# Patient Record
Sex: Female | Born: 1950 | Race: Black or African American | Hispanic: No | Marital: Married | Smoking: Never smoker
Health system: Southern US, Community
[De-identification: ages and names within clinical notes are randomized; demographics above are authoritative.]

## PROBLEM LIST (undated history)

## (undated) DIAGNOSIS — R112 Nausea with vomiting, unspecified: Secondary | ICD-10-CM

## (undated) DIAGNOSIS — R203 Hyperesthesia: Secondary | ICD-10-CM

## (undated) DIAGNOSIS — T859XXA Unspecified complication of internal prosthetic device, implant and graft, initial encounter: Secondary | ICD-10-CM

## (undated) DIAGNOSIS — M2550 Pain in unspecified joint: Secondary | ICD-10-CM

## (undated) DIAGNOSIS — I10 Essential (primary) hypertension: Secondary | ICD-10-CM

## (undated) DIAGNOSIS — Z91018 Allergy to other foods: Secondary | ICD-10-CM

## (undated) DIAGNOSIS — K5900 Constipation, unspecified: Secondary | ICD-10-CM

## (undated) DIAGNOSIS — E785 Hyperlipidemia, unspecified: Secondary | ICD-10-CM

## (undated) DIAGNOSIS — K219 Gastro-esophageal reflux disease without esophagitis: Secondary | ICD-10-CM

## (undated) DIAGNOSIS — R0609 Other forms of dyspnea: Secondary | ICD-10-CM

## (undated) DIAGNOSIS — M170 Bilateral primary osteoarthritis of knee: Secondary | ICD-10-CM

## (undated) DIAGNOSIS — E7800 Pure hypercholesterolemia, unspecified: Secondary | ICD-10-CM

## (undated) DIAGNOSIS — J302 Other seasonal allergic rhinitis: Secondary | ICD-10-CM

## (undated) DIAGNOSIS — Z9889 Other specified postprocedural states: Secondary | ICD-10-CM

## (undated) HISTORY — DX: Constipation, unspecified: K59.00

## (undated) HISTORY — PX: MASTECTOMY: SHX3

## (undated) HISTORY — PX: TONSILLECTOMY: SUR1361

## (undated) HISTORY — DX: Allergy to other foods: Z91.018

## (undated) HISTORY — PX: ABDOMINAL HYSTERECTOMY: SHX81

## (undated) HISTORY — DX: Pain in unspecified joint: M25.50

## (undated) HISTORY — PX: TOTAL KNEE ARTHROPLASTY: SHX125

## (undated) HISTORY — PX: APPENDECTOMY: SHX54

## (undated) HISTORY — PX: REMOVAL OF BILATERAL TISSUE EXPANDERS WITH PLACEMENT OF BILATERAL BREAST IMPLANTS: SHX6431

## (undated) HISTORY — DX: Hyperlipidemia, unspecified: E78.5

## (undated) HISTORY — PX: PLACEMENT OF BREAST IMPLANTS: SHX6334

## (undated) HISTORY — DX: Other forms of dyspnea: R06.09

## (undated) HISTORY — PX: BREAST BIOPSY: SHX20

---

## 2001-09-12 ENCOUNTER — Ambulatory Visit (HOSPITAL_BASED_OUTPATIENT_CLINIC_OR_DEPARTMENT_OTHER): Admission: RE | Admit: 2001-09-12 | Discharge: 2001-09-12 | Payer: Self-pay | Admitting: Orthopedic Surgery

## 2001-09-12 HISTORY — PX: KNEE ARTHROSCOPY: SUR90

## 2002-10-22 ENCOUNTER — Encounter: Payer: Self-pay | Admitting: Orthopedic Surgery

## 2002-10-25 ENCOUNTER — Inpatient Hospital Stay (HOSPITAL_COMMUNITY): Admission: RE | Admit: 2002-10-25 | Discharge: 2002-10-29 | Payer: Self-pay | Admitting: Orthopedic Surgery

## 2002-10-25 HISTORY — PX: TOTAL KNEE ARTHROPLASTY: SHX125

## 2007-09-17 ENCOUNTER — Ambulatory Visit (HOSPITAL_BASED_OUTPATIENT_CLINIC_OR_DEPARTMENT_OTHER): Admission: RE | Admit: 2007-09-17 | Discharge: 2007-09-17 | Payer: Self-pay | Admitting: Specialist

## 2007-09-17 ENCOUNTER — Encounter (INDEPENDENT_AMBULATORY_CARE_PROVIDER_SITE_OTHER): Payer: Self-pay | Admitting: Specialist

## 2007-09-17 HISTORY — PX: BREAST CAPSULECTOMY WITH IMPLANT EXCHANGE: SHX5592

## 2009-02-07 ENCOUNTER — Emergency Department (HOSPITAL_BASED_OUTPATIENT_CLINIC_OR_DEPARTMENT_OTHER): Admission: EM | Admit: 2009-02-07 | Discharge: 2009-02-07 | Payer: Self-pay | Admitting: Emergency Medicine

## 2009-02-07 ENCOUNTER — Ambulatory Visit: Payer: Self-pay | Admitting: Diagnostic Radiology

## 2011-03-01 NOTE — Op Note (Signed)
Brittney [name], [name]               ACCOUNT NO.:  [id number]   MEDICAL RECORD NO.:  [id number]          PATIENT TYPE:  AMB   LOCATION:  DSC                          FACILITY:  [facility]   PHYSICIAN:  Earvin Hansen L. Truesdale, M.D.DATE OF BIRTH:  23-Feb-1951   DATE OF PROCEDURE:  09/17/2007  DATE OF DISCHARGE:                               OPERATIVE REPORT   INDICATIONS:  A 60-year lady status post bilateral subcutaneous  mastectomies and reconstructions for severe precancerous fibrocystic  breast changes several years ago.  The patient had been lost to followup  for the last 8-9 years and came to see me within the last 2-3 weeks for  deflation of the right breast.  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  With implants being over 95 years old, I recommended  that we do a right capsulectomy, right implant exchange and left implant  exchange.  She also demonstrated some accessory breast tissue on the  right side that has caused her to have irritation and chaffing because  of the bulging.   PROCEDURES PLANNED:  Right capsulectomy, right implant exchange after  removal, left implant exchange, excision of accessory breast tissue  right breast upper quadrant and axillary regions.   ANESTHESIA:  General.   PROCEDURE IN DETAIL:  Preoperatively the patient was sat up and drawn  for the midline as well as demarcation of the inframammary folds,  anterior and anterior axillary lines.  She underwent general anesthesia  and intubated orally.  Prep was done to the chest, breast areas in a  routine fashion using Hibiclens soap and solution, walled off with  sterile towels and drapes thus making a sterile field.  One-quarter  percent Xylocaine with epinephrine injected locally in the right and  left breast areas.  The right inframammary fold incision was made  approximately 8 cm.  The incision was carried down to the  skin,  subcutaneous tissue and underlying pectoralis major muscle.  The muscle  was divided in the rectus fibers.  I was able to dissect down to the  capsule and using delicate dissection I was able to do an extra  capsulectomy with the implant still intact, but showing that it had  leakage from the saline compartment of a double-lumen type implant.  After proper hemostasis capsulectomy was fashioned throughout the space.  Hemostasis was maintained with the Bovie on coagulation.  After proper  hemostasis a Mentor smooth total saline implant, reference number  [id number], was placed within the pocket, 375 mL with good contour.  The muscles were repaired back with a 2-0 Monocryl, subcutaneous tissue  deep with 2-0 Monocryl.  The wounds were drained with a #7 fully fluted  Blake drain which was placed in the depths of the wound and brought out  through the lateral-most portion of the incision and when the skin was  closed subcuticularly with 3-0 Monocryl, it was tied off with the drain.   Next attention was drawn to the right axillary breast tissue.  A  curvilinear  incision made within the axillary area.  Dissection then  carried up to the excess tissue using Metzenbaum scissors.  I was able  to remove all that to give better contour and reduce the chance of  intertriginous changes in the future.   Next attention was drawn to the left breast tissue.  That inframammary  incision was opened.  Dissection was carried down the skin, subcutaneous  tissue, breast tissue to the underlying pectoralis major muscle.  The  muscle was divided in the direction of its fibers and that implant was  removed and then replaced likewise with a same type implant, Mentor  total saline, reference number [id number], lot number is [id number], the same  amount, 375 mL, with good contour.  It was closed likewise with 2-0  Monocryl in muscles and subcutaneous tissue and then running  subcuticular stitch of 3-0 Monocryl.   Steri-Strips and soft dressing was  applied to all the areas, a fluffy dressing and proper breast dressings.  She tolerated all procedures very well.  Estimated blood loss less than  100 mL.  Complications none.  She was then taken to recovery in  excellent condition.      Yaakov Guthrie. Shon Hough, M.D.  Electronically Signed     GLT/MEDQ  D:  09/17/2007  T:  09/17/2007  Job:  [id number]

## 2011-03-04 NOTE — Op Note (Signed)
[place]. [facility]  Patient:    [name], [name] Visit Number: [id number] MRN: [id number]          Service Type: Attending:  Harvie Junior, M.D. Dictated by:   Harvie Junior, M.D. Proc. Date: 09/12/01                             Operative Report  PREOPERATIVE DIAGNOSIS:  Lateral meniscal tear.  POSTOPERATIVE DIAGNOSIS: 1. Lateral meniscal tear. 2. Significant defect, lateral tibial plateau. 3. Multiple osteocartilaginous loose bodies. 4. Chondromalacia patella.  OPERATION PERFORMED: 1. Debridement of lateral meniscal tear. 2. Debridement of lateral tibial plateau. 3. Removal of multiple osteocartilaginous loose bodies with a grasper. 4. Debridement of chondromalacia patella.  SURGEON:  Harvie Junior, M.D.  ASSISTANT:  Currie Paris. Thedore Mins.  ANESTHESIA:  General.  INDICATIONS FOR PROCEDURE:  The patient is a 60 year old female with a long history of having significant lateral jointline pain.  She was having catching and locking.  She ultimately had a steroid injection which helped for a period of time but then ultimately results failed.  Because of complaints of intermittent catching and locking, the patient was taken to the operating room for examination under anesthesia and arthroscopy.  DESCRIPTION OF PROCEDURE:  The patient was taken to the operating room and after anesthetic was obtained with general anesthetic, the patient was positioned on the operating table.  The right leg was then prepped and draped in the usual sterile fashion.  Following this, routine arthroscopic examination of the knee revealed that there was an obvious tear of the posterolateral meniscus.  This was debrided with a straight-biting forceps back to a stable rim.  This unfortunately was almost all the way back to the rim posteriorly.  What was noted at the more significant abnormality was grade 4 change on the lateral tibial plateau and interestingly there was  no corresponding lesion on the lateral femoral condyle at least to flexion to 90 degrees.  The lateral tibial plateau lesion was debrided.  Multiple osteocartilaginous loose bodies were identified within the knee and were removed with a grasper.  The anterior cruciate was within normal limits.  The medial compartment was within normal limits.  Attention was turned up into the patellofemoral joint where there was noted to be some grade 2 and 3 changes in the patellofemoral joint.  The patella was debrided with a suction shaver.  At this point the cannula was removed.  The knee was suctioned dry.  One final check was made for any loose or fragmented pieces.  A final check was made with a probe to make sure the meniscus was stable and it was.  At this point the knee was copiously irrigated and suctioned dry.  Of note, the camera did fail during the case in terms of being able to take pictures.  We had excellent visualization throughout the case but the camera did fail at the midportion of the case. Dictated by:   Harvie Junior, M.D. Attending:  Harvie Junior, M.D. DD:  09/12/01 TD:  09/12/01 Job: [id number]

## 2011-03-04 NOTE — Op Note (Signed)
NAME:  [name], [name] NO.:  [id number]   MEDICAL RECORD NO.:  [id number]                   PATIENT TYPE:  INP   LOCATION:  2550                                 FACILITY:  [facility]   PHYSICIAN:  Harvie Junior, M.D.                DATE OF BIRTH:  1951-04-10   DATE OF PROCEDURE:  10/25/2002  DATE OF DISCHARGE:                                 OPERATIVE REPORT   PREOPERATIVE DIAGNOSIS:  Degenerative disk disease, right knee.   POSTOPERATIVE DIAGNOSIS:  Degenerative disk disease, right knee.   OPERATION PERFORMED:  Right total knee replacement with Depuy LCS system,  standard femoral component.  A 2.5 mm tibial component and a 10 mm bridging  bearing 35 mm all poly patella.   SURGEON:  Harvie Junior, M.D.   ASSISTANT:  Marshia Ly, P.A.   ANESTHESIA:  General.   INDICATIONS FOR PROCEDURE:  The patient is a 60 year old female with a long  history of having a significant lateral compartment disease.  She had  undergone arthroscopy, debridement, cortisone injection, Hyalgan injection.  All of this provided some temporary relief but because of continued  complaints of pain on the lateral side and knowing that she had significant  degeneration from arthritis, the patient has elected to undergo total knee  replacement.  She is brought to the operating room for this procedure.   DESCRIPTION OF PROCEDURE:  The patient was brought to the operating room and  after adequate anesthesia was obtained with general anesthetic, the patient  was placed supine on the operating table.  Right leg was prepped and draped  in sterile fashion.  Following Esmarch exsanguination of the extremity,  blood pressure tourniquet was inflated to 350 mmHg.  Following this, a  midline incision was made, subcutaneous tissue dissected down to the level  of the patella.  Extensor mechanism was identified and retracted medially.  The medial parapatellar arthrotomy was undertaken.   Medial and lateral  meniscectomy, anterior and posterior cruciate excision and following this, a  guide was used to gain alignment of the distal tibia.  The tibial guide was  cut perpendicular to the floor with a long axis, 10 degree posterior slope.  Following this, attention was turned to the femur which was sized to a  standard.  Cuts were made and a 40 degree valgus cut was made and Chamfer  cuts and the patient was then brought out for sizing the tibia 2.5 mm seemed  to be the best fit and this was impacted into place.  The trial components  were then put in place and range of motion was achieved.  Excellent range of  motion was achieved.  35 mm all poly patella was chosen and range of motion  was chosen with this.  The wound was then copiously irrigated and suctioned  dry.  Components  were removed.  A pulsatile lavage irrigation  was used and  the areas were dried.  The tibial component was then put into place first  and then the keel and the spines.  Following this, attention was turned to  the femoral side where the femoral component was cemented into placed with  the bridging bearing in place.  All excess cement was removed.  Attention  was turned to the patella.  The patella was cemented into place.  Excellent  range of motion was achieved and alignment was achieved.  At this point the  cement was allowed to dry with the knee in 10 degrees of flexion with  pressure and the patella clamp in place.  All excess cement had been  removed.  The knee was then copiously irrigated with pulsatile lavage  irrigation.  Medium Hemovac drain was put in place.  Medial parapatellar  arthrotomy was closed and the tourniquet was let down.  The remainder of the  closure was undertaken at this point with 0 and 2-0 Vicryl and 3-0 Maxon  subcuticular stitch.  Sterile compressive dressing was applied at this point  and the patient was taken to the recovery room where she was noted to be in  satisfactory  condition.  The estimated blood loss for this procedure was  none.                                                Harvie Junior, M.D.    Ranae Plumber  D:  10/25/2002  T:  10/25/2002  Job:  [id number]

## 2011-03-04 NOTE — Discharge Summary (Signed)
[name], [name] NO.:  [id number]   MEDICAL RECORD NO.:  [id number]                   PATIENT TYPE:  INP   LOCATION:  5005                                 FACILITY:  [facility]   PHYSICIAN:  Harvie Junior, M.D.                DATE OF BIRTH:  Feb 11, 1951   DATE OF ADMISSION:  10/25/2002  DATE OF DISCHARGE:  10/29/2002                                 DISCHARGE SUMMARY   ADMISSION DIAGNOSES:  1. Degenerative joint disease right knee.  2. Hypertension.  3. Gastroesophageal reflux disease.   DISCHARGE DIAGNOSES:  1. Degenerative joint disease right knee.  2. Hypertension.  3. Gastroesophageal reflux disease.   PROCEDURE:  Right total knee arthroplasty per Harvie Junior, M.D., on  October 25, 2002.   HISTORY OF PRESENT ILLNESS:  The patient is a 60 year old female with a long  history of right knee pain.  She has pain with ambulation and pain at night  which is waking her up from sleep.  She had a right knee arthroscopy done in  November of 2002 which showed degenerative changes.  She underwent Synvisc  injections and did have some cortisone injections which both gave her  temporary relief but she needed to have persistent disability with her right  knee and inability to ambulate without pain.  X-ray showed degenerative  changes with significant joint space narrowing and arthritic changes.  Based  upon her clinical and radiographic findings, she was felt to be a candidate  for right total knee arthroplasty and she was admitted for this.   PERTINENT LABORATORY DATA:  EKG on admission showed normal sinus rhythm,  normal EKG.   Hemoglobin on admission was 14.5, on postoperative day #1 her hemoglobin was  10.6, postoperative day #2 was 12.5, postoperative day #3 was 11.1.  Indices  within normal limits.  Protime on admission was 13.3 seconds with INR of  1.0, PTT was 33.  On the day of discharge  her protime was 19.8 seconds with  INR of 1.8 on  Coumadin therapy.  CMET on admission was within normal limits.  BMET on postoperative day #1 was normal.  Urinalysis showed no  abnormalities.   HOSPITAL COURSE:  The patient underwent right total knee arthroplasty as  well described in Dr. Luiz Blare' operative notes.  She was continued on her  usual home medications.  She was put on Ancef 1 g IV q.8h. x6 doses,  Coumadin for DVT prophylaxis x1 month postoperative and physical therapy was  ordered.  PCA morphine pump was used for pain control.  A CPM machine was  also used at the right knee for range of motion.   On postoperative day #1, she had some mild nausea which improved.  Her Foley  catheter was discontinued.  Her hemoglobin was 10.6 and INR was 1.2.   On postoperative day #2, her right knee dressing was changed  and her Hemovac  drains were pulled.  Her hemoglobin was 12.5 and INR was 1.2.   On postoperative day #3, she was doing well.  She was not ready for  discharge home yet because she had not become independent with therapy.  Her  vital signs were stable and she was afebrile.  Her knee wound was benign.  She had significant swelling in her knee.  She was continued with physical  therapy and on postoperative day #4, she was doing well.  She was eating and  voiding without difficulty.  Vital signs were stable and she was afebrile.  Her wound was benign.  Her calf was soft without sign of DVT.  Her INR was  1.8.   DISPOSITION:  She was discharged home in improved condition.   DIET:  Regular diet.   DISCHARGE INSTRUCTIONS:  She will need home health physical therapy three  times a week, CPM machine starting at 0 degrees to 70 degrees increasing 5  degrees daily up to 90 degrees.  She will ambulate weightbearing as  tolerated on the right with a walker.   MEDICATIONS:  She was given prescription for Coumadin x1 month postoperative  per pharmacy protocol and Percocet 5 mg p.r.n. for pain.   FOLLOW UP:  She will follow up with  Dr. Luiz Blare in two weeks.  She will call  if there are any problems.     Marshia Ly, P.A.                       Harvie Junior, M.D.    Cordelia Pen  D:  12/18/2002  T:  12/18/2002  Job:  [id number]   cc:   Molly Maduro L. Foy [place], M.D.  [address] [place]  [place] [address]  Fax: [phone]

## 2013-06-17 DIAGNOSIS — T859XXA Unspecified complication of internal prosthetic device, implant and graft, initial encounter: Secondary | ICD-10-CM | POA: Insufficient documentation

## 2013-06-17 HISTORY — DX: Unspecified complication of internal prosthetic device, implant and graft, initial encounter: T85.9XXA

## 2013-07-15 ENCOUNTER — Encounter (HOSPITAL_BASED_OUTPATIENT_CLINIC_OR_DEPARTMENT_OTHER): Payer: Self-pay | Admitting: *Deleted

## 2013-07-15 NOTE — Pre-Procedure Instructions (Signed)
To come for EKG; had CMET 07/08/2013 at Dr. Pablo Lawrence office

## 2013-07-18 ENCOUNTER — Encounter (HOSPITAL_BASED_OUTPATIENT_CLINIC_OR_DEPARTMENT_OTHER)
Admission: RE | Admit: 2013-07-18 | Discharge: 2013-07-18 | Disposition: A | Discharge status: Home / Self Care | Payer: Managed Care, Other (non HMO) | Source: Ambulatory Visit | Attending: Specialist | Admitting: Specialist

## 2013-07-18 DIAGNOSIS — Z01810 Encounter for preprocedural cardiovascular examination: Secondary | ICD-10-CM | POA: Insufficient documentation

## 2013-07-22 ENCOUNTER — Ambulatory Visit (HOSPITAL_BASED_OUTPATIENT_CLINIC_OR_DEPARTMENT_OTHER)
Admission: RE | Admit: 2013-07-22 | Discharge: 2013-07-22 | Disposition: A | Discharge status: Home / Self Care | Payer: Managed Care, Other (non HMO) | Source: Ambulatory Visit | Attending: Specialist | Admitting: Specialist

## 2013-07-22 ENCOUNTER — Encounter (HOSPITAL_BASED_OUTPATIENT_CLINIC_OR_DEPARTMENT_OTHER): Payer: Self-pay | Admitting: *Deleted

## 2013-07-22 ENCOUNTER — Encounter (HOSPITAL_BASED_OUTPATIENT_CLINIC_OR_DEPARTMENT_OTHER): Payer: Self-pay | Admitting: Anesthesiology

## 2013-07-22 ENCOUNTER — Encounter (HOSPITAL_BASED_OUTPATIENT_CLINIC_OR_DEPARTMENT_OTHER)
Admission: RE | Disposition: A | Discharge status: Home / Self Care | Payer: Self-pay | Source: Ambulatory Visit | Attending: Specialist

## 2013-07-22 ENCOUNTER — Ambulatory Visit (HOSPITAL_BASED_OUTPATIENT_CLINIC_OR_DEPARTMENT_OTHER): Payer: Managed Care, Other (non HMO) | Admitting: Anesthesiology

## 2013-07-22 DIAGNOSIS — I10 Essential (primary) hypertension: Secondary | ICD-10-CM | POA: Insufficient documentation

## 2013-07-22 DIAGNOSIS — N650 Deformity of reconstructed breast: Secondary | ICD-10-CM | POA: Insufficient documentation

## 2013-07-22 HISTORY — DX: Other seasonal allergic rhinitis: J30.2

## 2013-07-22 HISTORY — DX: Other specified postprocedural states: R11.2

## 2013-07-22 HISTORY — DX: Essential (primary) hypertension: I10

## 2013-07-22 HISTORY — PX: BREAST CAPSULECTOMY WITH IMPLANT EXCHANGE: SHX5592

## 2013-07-22 HISTORY — DX: Hyperesthesia: R20.3

## 2013-07-22 HISTORY — DX: Unspecified complication of internal prosthetic device, implant and graft, initial encounter: T85.9XXA

## 2013-07-22 HISTORY — DX: Bilateral primary osteoarthritis of knee: M17.0

## 2013-07-22 HISTORY — DX: Pure hypercholesterolemia, unspecified: E78.00

## 2013-07-22 HISTORY — DX: Other specified postprocedural states: Z98.890

## 2013-07-22 HISTORY — DX: Gastro-esophageal reflux disease without esophagitis: K21.9

## 2013-07-22 LAB — POCT HEMOGLOBIN-HEMACUE: Hemoglobin: 13.8 g/dL (ref 12.0–15.0)

## 2013-07-22 SURGERY — CAPSULECTOMY, BREAST, WITH REPLACEMENT OF IMPLANT
Anesthesia: General | Site: Breast | Laterality: Bilateral | Wound class: Clean

## 2013-07-22 MED ORDER — OXYCODONE HCL 5 MG PO TABS: 5.0000 mg | ORAL_TABLET | Freq: Once | ORAL | Status: DC | PRN

## 2013-07-22 MED ORDER — PROPOFOL 10 MG/ML IV BOLUS
INTRAVENOUS | Status: DC | PRN
  Administered 2013-07-22: 30 mg via INTRAVENOUS
  Administered 2013-07-22: 200 mg via INTRAVENOUS

## 2013-07-22 MED ORDER — DEXAMETHASONE SODIUM PHOSPHATE 4 MG/ML IJ SOLN
INTRAMUSCULAR | Status: DC | PRN
  Administered 2013-07-22: 10 mg via INTRAVENOUS

## 2013-07-22 MED ORDER — LIDOCAINE HCL (CARDIAC) 20 MG/ML IV SOLN
INTRAVENOUS | Status: DC | PRN
  Administered 2013-07-22: 80 mg via INTRAVENOUS

## 2013-07-22 MED ORDER — CEFAZOLIN SODIUM-DEXTROSE 2-3 GM-% IV SOLR
2.0000 g | INTRAVENOUS | Status: AC
  Administered 2013-07-22: 2 g via INTRAVENOUS

## 2013-07-22 MED ORDER — MIDAZOLAM HCL 5 MG/5ML IJ SOLN
INTRAMUSCULAR | Status: DC | PRN
  Administered 2013-07-22: 2 mg via INTRAVENOUS

## 2013-07-22 MED ORDER — SODIUM CHLORIDE 0.9 % IV SOLN
INTRAVENOUS | Status: DC | PRN
  Administered 2013-07-22: 09:00:00 via SURGICAL_CAVITY

## 2013-07-22 MED ORDER — HYDROMORPHONE HCL PF 1 MG/ML IJ SOLN
0.2500 mg | INTRAMUSCULAR | Status: DC | PRN
  Administered 2013-07-22 (×3): 0.5 mg via INTRAVENOUS

## 2013-07-22 MED ORDER — PROMETHAZINE HCL 25 MG/ML IJ SOLN
6.2500 mg | INTRAMUSCULAR | Status: DC | PRN
  Administered 2013-07-22: 6.25 mg via INTRAVENOUS

## 2013-07-22 MED ORDER — OXYCODONE HCL 5 MG/5ML PO SOLN: 5.0000 mg | Freq: Once | ORAL | Status: DC | PRN

## 2013-07-22 MED ORDER — SCOPOLAMINE 1 MG/3DAYS TD PT72
1.0000 | MEDICATED_PATCH | TRANSDERMAL | Status: DC
  Administered 2013-07-22: 1.5 mg via TRANSDERMAL

## 2013-07-22 MED ORDER — GLYCOPYRROLATE 0.2 MG/ML IJ SOLN
INTRAMUSCULAR | Status: DC | PRN
  Administered 2013-07-22: .5 mg via INTRAVENOUS

## 2013-07-22 MED ORDER — MIDAZOLAM HCL 2 MG/ML PO SYRP: 12.0000 mg | ORAL_SOLUTION | Freq: Once | ORAL | Status: DC | PRN

## 2013-07-22 MED ORDER — MIDAZOLAM HCL 2 MG/2ML IJ SOLN: 1.0000 mg | INTRAMUSCULAR | Status: DC | PRN

## 2013-07-22 MED ORDER — ONDANSETRON HCL 4 MG/2ML IJ SOLN
INTRAMUSCULAR | Status: DC | PRN
  Administered 2013-07-22: 4 mg via INTRAVENOUS

## 2013-07-22 MED ORDER — NEOSTIGMINE METHYLSULFATE 1 MG/ML IJ SOLN
INTRAMUSCULAR | Status: DC | PRN
  Administered 2013-07-22: 3 mg via INTRAVENOUS

## 2013-07-22 MED ORDER — FENTANYL CITRATE 0.05 MG/ML IJ SOLN: 50.0000 ug | INTRAMUSCULAR | Status: DC | PRN

## 2013-07-22 MED ORDER — CEFAZOLIN SODIUM 1 G IJ SOLR
INTRAMUSCULAR | Status: DC | PRN
  Administered 2013-07-22: 10:00:00

## 2013-07-22 MED ORDER — EPHEDRINE SULFATE 50 MG/ML IJ SOLN
INTRAMUSCULAR | Status: DC | PRN
  Administered 2013-07-22: 10 mg via INTRAVENOUS
  Administered 2013-07-22: 5 mg via INTRAVENOUS
  Administered 2013-07-22 (×2): 10 mg via INTRAVENOUS
  Administered 2013-07-22: 15 mg via INTRAVENOUS
  Administered 2013-07-22: 10 mg via INTRAVENOUS

## 2013-07-22 MED ORDER — ROCURONIUM BROMIDE 100 MG/10ML IV SOLN
INTRAVENOUS | Status: DC | PRN
  Administered 2013-07-22: 50 mg via INTRAVENOUS

## 2013-07-22 MED ORDER — FENTANYL CITRATE 0.05 MG/ML IJ SOLN
INTRAMUSCULAR | Status: DC | PRN
  Administered 2013-07-22: 25 ug via INTRAVENOUS
  Administered 2013-07-22: 100 ug via INTRAVENOUS
  Administered 2013-07-22 (×3): 25 ug via INTRAVENOUS

## 2013-07-22 MED ORDER — LACTATED RINGERS IV SOLN
INTRAVENOUS | Status: DC
  Administered 2013-07-22: 07:00:00 via INTRAVENOUS

## 2013-07-22 SURGICAL SUPPLY — 61 items
BAG DECANTER FOR FLEXI CONT (MISCELLANEOUS) ×2 IMPLANT
BENZOIN TINCTURE PRP APPL 2/3 (GAUZE/BANDAGES/DRESSINGS) ×4 IMPLANT
BINDER BREAST XXLRG (GAUZE/BANDAGES/DRESSINGS) ×2 IMPLANT
BLADE HEX COATED 2.75 (ELECTRODE) ×2 IMPLANT
BLADE KNIFE PERSONA 10 (BLADE) ×2 IMPLANT
BLADE KNIFE PERSONA 15 (BLADE) ×2 IMPLANT
CANISTER SUCTION 1200CC (MISCELLANEOUS) ×2 IMPLANT
CLOTH BEACON ORANGE TIMEOUT ST (SAFETY) ×2 IMPLANT
COVER MAYO STAND STRL (DRAPES) ×2 IMPLANT
COVER TABLE BACK 60X90 (DRAPES) ×2 IMPLANT
DECANTER SPIKE VIAL GLASS SM (MISCELLANEOUS) ×2 IMPLANT
DRAIN CHANNEL 10M FLAT 3/4 FLT (DRAIN) ×2 IMPLANT
DRAIN PENROSE 1/4X12 LTX STRL (WOUND CARE) IMPLANT
DRAPE LAPAROSCOPIC ABDOMINAL (DRAPES) ×2 IMPLANT
DRSG PAD ABDOMINAL 8X10 ST (GAUZE/BANDAGES/DRESSINGS) ×4 IMPLANT
ELECT BLADE 6.5 .24CM SHAFT (ELECTRODE) ×2 IMPLANT
ELECT REM PT RETURN 9FT ADLT (ELECTROSURGICAL) ×2
ELECTRODE REM PT RTRN 9FT ADLT (ELECTROSURGICAL) ×1 IMPLANT
EVACUATOR SILICONE 100CC (DRAIN) ×4 IMPLANT
FILTER 7/8 IN (FILTER) ×2 IMPLANT
GAUZE XEROFORM 5X9 LF (GAUZE/BANDAGES/DRESSINGS) ×2 IMPLANT
GLOVE BIO SURGEON STRL SZ 6.5 (GLOVE) ×2 IMPLANT
GLOVE BIOGEL M STRL SZ7.5 (GLOVE) ×2 IMPLANT
GLOVE BIOGEL PI IND STRL 8 (GLOVE) ×1 IMPLANT
GLOVE BIOGEL PI INDICATOR 8 (GLOVE) ×1
GLOVE ECLIPSE 7.0 STRL STRAW (GLOVE) ×2 IMPLANT
GOWN PREVENTION PLUS XLARGE (GOWN DISPOSABLE) IMPLANT
GOWN PREVENTION PLUS XXLARGE (GOWN DISPOSABLE) ×4 IMPLANT
IMPL BREAST MP 425CC (Breast) ×2 IMPLANT
IMPLANT BREAST MP 425CC (Breast) ×4 IMPLANT
IV NS 500ML (IV SOLUTION) ×3
IV NS 500ML BAXH (IV SOLUTION) ×3 IMPLANT
KIT FILL SYSTEM UNIVERSAL (SET/KITS/TRAYS/PACK) ×2 IMPLANT
NEEDLE HYPO 25X1 1.5 SAFETY (NEEDLE) ×2 IMPLANT
NEEDLE SPNL 18GX3.5 QUINCKE PK (NEEDLE) ×2 IMPLANT
NS IRRIG 1000ML POUR BTL (IV SOLUTION) IMPLANT
PACK BASIN DAY SURGERY FS (CUSTOM PROCEDURE TRAY) ×2 IMPLANT
PEN SKIN MARKING BROAD TIP (MISCELLANEOUS) ×2 IMPLANT
PIN SAFETY STERILE (MISCELLANEOUS) IMPLANT
SLEEVE SCD COMPRESS KNEE MED (MISCELLANEOUS) ×2 IMPLANT
SPONGE GAUZE 4X4 12PLY (GAUZE/BANDAGES/DRESSINGS) ×2 IMPLANT
SPONGE LAP 18X18 X RAY DECT (DISPOSABLE) ×8 IMPLANT
STRIP SUTURE WOUND CLOSURE 1/2 (SUTURE) ×4 IMPLANT
SUT ETHILON 3 0 PS 1 (SUTURE) ×2 IMPLANT
SUT MNCRL AB 3-0 PS2 18 (SUTURE) ×6 IMPLANT
SUT MON AB 2-0 CT1 36 (SUTURE) ×4 IMPLANT
SUT MON AB 5-0 PS2 18 (SUTURE) IMPLANT
SUT PROLENE 3 0 PS 2 (SUTURE) IMPLANT
SYR 20CC LL (SYRINGE) IMPLANT
SYR 50ML LL SCALE MARK (SYRINGE) ×4 IMPLANT
SYR BULB IRRIGATION 50ML (SYRINGE) ×2 IMPLANT
SYR CONTROL 10ML LL (SYRINGE) ×4 IMPLANT
TAPE HYPAFIX 6X30 (GAUZE/BANDAGES/DRESSINGS) ×2 IMPLANT
TAPE MEASURE 72IN RETRACT (INSTRUMENTS)
TAPE MEASURE LINEN 72IN RETRCT (INSTRUMENTS) IMPLANT
TOWEL OR 17X24 6PK STRL BLUE (TOWEL DISPOSABLE) ×8 IMPLANT
TRAY DSU PREP LF (CUSTOM PROCEDURE TRAY) ×2 IMPLANT
TUBE CONNECTING 20X1/4 (TUBING) ×2 IMPLANT
UNDERPAD 30X30 INCONTINENT (UNDERPADS AND DIAPERS) ×4 IMPLANT
VAC PENCILS W/TUBING CLEAR (MISCELLANEOUS) ×2 IMPLANT
YANKAUER SUCT BULB TIP NO VENT (SUCTIONS) ×2 IMPLANT

## 2013-07-22 NOTE — Anesthesia Procedure Notes (Signed)
Procedure Name: Intubation Performed by: Aquita Simmering W Pre-anesthesia Checklist: Patient identified, Timeout performed, Emergency Drugs available, Suction available and Patient being monitored Patient Re-evaluated:Patient Re-evaluated prior to inductionOxygen Delivery Method: Circle system utilized Preoxygenation: Pre-oxygenation with 100% oxygen Intubation Type: IV induction Ventilation: Mask ventilation without difficulty Laryngoscope Size: Miller and 2 Grade View: Grade I Tube type: Oral Tube size: 7.0 mm Number of attempts: 1 Airway Equipment and Method: Stylet Placement Confirmation: ETT inserted through vocal cords under direct vision,  breath sounds checked- equal and bilateral and positive ETCO2 Secured at: 21 cm Tube secured with: Tape Dental Injury: Teeth and Oropharynx as per pre-operative assessment      

## 2013-07-22 NOTE — Brief Op Note (Signed)
07/22/2013  10:12 AM  PATIENT:  [name]  62 y.o. female  PRE-OPERATIVE DIAGNOSIS:  BREAST IMPLANT COMPLICATION  POST-OPERATIVE DIAGNOSIS:  BREAST IMPLANT COMPLICATION  PROCEDURE:  Procedure(s): BILATERAL BREAST CAPSULECTOMY WITH IMPLANT EXCHANGE AND RIGHT RECONSTRUCTION/REVISION (Bilateral)  SURGEON:  Surgeon(s) and Role:    * Louisa Second, MD - Primary  PHYSICIAN ASSISTANT:   ASSISTANTS: none   ANESTHESIA:   general  EBL:  Total I/O In: 2000 [I.V.:2000] Out: -   BLOOD ADMINISTERED:none  DRAINS: (right lateral chest area) Jackson-Pratt drain(s) with closed bulb suction in the right lateral chest area   LOCAL MEDICATIONS USED:  XYLOCAINE   SPECIMEN:  Excision  DISPOSITION OF SPECIMEN:  PATHOLOGY  COUNTS:  YES  TOURNIQUET:  * No tourniquets in log *  DICTATION: .Other Dictation: Dictation Number [id number]  PLAN OF CARE: Discharge to home after PACU  PATIENT DISPOSITION:  PACU - hemodynamically stable.   Delay start of Pharmacological VTE agent (>24hrs) due to surgical blood loss or risk of bleeding: yes

## 2013-07-22 NOTE — Anesthesia Postprocedure Evaluation (Signed)
  Anesthesia Post-op Note  Patient: [name]  Procedure(s) Performed: Procedure(s): BILATERAL BREAST CAPSULECTOMY WITH IMPLANT EXCHANGE AND RIGHT RECONSTRUCTION/REVISION (Bilateral)  Patient Location: PACU  Anesthesia Type:General  Level of Consciousness: awake and alert   Airway and Oxygen Therapy: Patient Spontanous Breathing  Post-op Pain: mild  Post-op Assessment: Post-op Vital signs reviewed  Post-op Vital Signs: stable  Complications: No apparent anesthesia complications

## 2013-07-22 NOTE — H&P (Signed)
[name] is an 62 y.o. female.   Chief Complaint: Deformation of the right reconstructed implant reconstruction.Scar tissue on left HPI: SP BILATERA BREAST RECOSRUCTIONS AND reconstructions in the pastx2. Patient fell approx 1 year ago and caused significant deformity of the right reconstructed side. The left side also shows capsular formation.  Past Medical History  Diagnosis Date  . Seasonal allergies   . GERD (gastroesophageal reflux disease)   . Osteoarthritis of both knees   . PONV (postoperative nausea and vomiting)   . High cholesterol   . Hypertension     under control with med., has been on med. x 5 yr.  . Complication of breast implant 06/2013  . Sensitive skin     Past Surgical History  Procedure Laterality Date  . Appendectomy    . Abdominal hysterectomy      partial - age 23  . Tonsillectomy      age 56  . Mastectomy Bilateral   . Breast biopsy      x 2  . Removal of bilateral tissue expanders with placement of bilateral breast implants    . Placement of breast implants      after mastectomy  . Knee arthroscopy Right 09/12/2001  . Total knee arthroplasty Right 10/25/2002  . Breast capsulectomy with implant exchange Right 09/17/2007    with exc. accessory breast tissue right breast and axilla    History reviewed. No pertinent family history. Social History:  reports that she has never smoked. She has never used smokeless tobacco. She reports that she does not drink alcohol or use illicit drugs.  Allergies: No Known Allergies  Medications Prior to Admission  Medication Sig Dispense Refill  . acetaminophen (TYLENOL) 500 MG tablet Take 500 mg by mouth every 6 (six) hours as needed for pain.      Marland Kitchen amLODipine (NORVASC) 5 MG tablet Take 5 mg by mouth daily.      Marland Kitchen atorvastatin (LIPITOR) 20 MG tablet Take 20 mg by mouth daily.      . diclofenac (VOLTAREN) 75 MG EC tablet Take 75 mg by mouth 2 (two) times daily.      . diphenhydrAMINE (BENADRYL) 25 MG tablet Take  12.5 mg by mouth every 6 (six) hours as needed for itching.      . estradiol (VIVELLE-DOT) 0.05 MG/24HR patch Place 1 patch onto the skin once a week.      . loratadine (CLARITIN) 10 MG tablet Take 10 mg by mouth daily.      . ranitidine (ZANTAC) 150 MG capsule Take 150 mg by mouth 2 (two) times daily.      Marland Kitchen triamterene-hydrochlorothiazide (DYAZIDE) 37.5-25 MG per capsule Take 1 capsule by mouth every morning.        Results for orders placed during the hospital encounter of 07/22/13 (from the past 48 hour(s))  POCT HEMOGLOBIN-HEMACUE     Status: None   Collection Time    07/22/13  7:18 AM      Result Value Range   Hemoglobin 13.8  12.0 - 15.0 g/dL   No results found.  Review of Systems  Constitutional: Negative.   HENT: Negative.   Eyes: Negative.   Respiratory: Negative.   Cardiovascular: Negative.   Gastrointestinal: Positive for heartburn.  Musculoskeletal: Positive for myalgias.  Skin: Negative.   Neurological: Negative.   Endo/Heme/Allergies: Negative.   Psychiatric/Behavioral: Negative.     Blood pressure 134/86, pulse 78, temperature 98.5 F (36.9 C), temperature source Oral, resp. rate 18, height 5'  5" (1.651 m), weight 91.627 kg (202 lb), SpO2 99.00%. Physical Exam   Assessment/Plan Severe deformity of the righrt reconstructed breast and capsular formation on the left for bilateral implant removals with reconstruction revision on right with bilateral capsular removals  Niel Peretti L 07/22/2013, 7:46 AM

## 2013-07-22 NOTE — Transfer of Care (Signed)
Immediate Anesthesia Transfer of Care Note  Patient: [name]  Procedure(s) Performed: Procedure(s): BILATERAL BREAST CAPSULECTOMY WITH IMPLANT EXCHANGE AND RIGHT RECONSTRUCTION/REVISION (Bilateral)  Patient Location: PACU  Anesthesia Type:General  Level of Consciousness: awake and alert   Airway & Oxygen Therapy: Patient Spontanous Breathing and Patient connected to face mask oxygen  Post-op Assessment: Report given to PACU RN and Post -op Vital signs reviewed and stable  Post vital signs: Reviewed and stable  Complications: No apparent anesthesia complications

## 2013-07-22 NOTE — Anesthesia Preprocedure Evaluation (Addendum)
Anesthesia Evaluation  Patient identified by MRN, date of birth, ID band Patient awake    History of Anesthesia Complications (+) PONV  Airway Mallampati: I      Dental  (+)    Pulmonary neg pulmonary ROS,  breath sounds clear to auscultation        Cardiovascular hypertension, Rhythm:Regular Rate:Normal     Neuro/Psych negative neurological ROS     GI/Hepatic GERD-  ,  Endo/Other    Renal/GU      Musculoskeletal   Abdominal   Peds  Hematology   Anesthesia Other Findings Outer edge of upper left front tooth chipped preop.   Reproductive/Obstetrics                          Anesthesia Physical Anesthesia Plan  ASA: II  Anesthesia Plan: General   Post-op Pain Management:    Induction: Intravenous  Airway Management Planned: Oral ETT  Additional Equipment:   Intra-op Plan:   Post-operative Plan: Extubation in OR  Informed Consent:   Dental advisory given  Plan Discussed with: CRNA and Surgeon  Anesthesia Plan Comments:         Anesthesia Quick Evaluation

## 2013-07-23 ENCOUNTER — Encounter (HOSPITAL_BASED_OUTPATIENT_CLINIC_OR_DEPARTMENT_OTHER): Payer: Self-pay | Admitting: Specialist

## 2013-07-23 NOTE — Op Note (Signed)
NAMEATINA, FEELEY NO.:  [id number]  MEDICAL RECORD NO.:  [id number]  LOCATION:                               FACILITY:  [facility]  PHYSICIAN:  Earvin Hansen L. 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  Recently, approximately a year and half ago, the patient states that she fell at home and sustained a severe hematoma formation above the right breast, was seen in consultation elsewhere, and presented to me approximately 3 weeks ago, with a very deformed breast reconstruction on the right side with the implant very high with severe capsule formation Baker's 4.  She also seems to pat the nipple-areolar complex that could migrate down where it is with scar tissue as well.  On the left side, she has indentation at the medial portion of the breast reconstruction as well as capsule formation Baker's 3 and 4.  PROCEDURES:  Planned reconstruction right breast and revision, excision, extra capsule formation, removal of implant and capsule, reconstruction with mentor smooth implant, 650 mL on the right side, 800 mL on the left.  On the left side, left capsule formation excision with implant exchange.  PREOPERATIVE:  The patient underwent drawings of all the areas including the midline of the breast and chest and anterior axillary lines, and the inframammary folds.  She then underwent general anesthesia and intubated orally.  Prep was done to the chest breast areas in routine fashion using Hibiclens soap and solution, walled off with sterile towels and drapes so as to make a sterile field.  One 4% Xylocaine with epinephrine was injected locally 1:400,000  concentration, total of 200 mL.  This was allowed to set up.  I did the left side first, did an areolar lift on that side, dissection was carried down to skin and subcutaneous tissue. The epithelialization of the area was done with a #15 blade and dissection was carried down through to the underlying pectoralis major muscle.  The muscles were divided in direction of its fibers to the underlying structures.  The capsule was removed medially and then the implant removed and replaced with a mentor implants, 650 mL total.  The lot #[id number], serial #[id number] gave excellent symmetry.  The muscles were then was repaired back with 2-0 Monocryl, subcutaneous tissue and breast tissue with 2-0 Monocryl, and then the skin edges were approximated with a running subcuticular stitch of 3-0 Monocryl.  Next, attention was drawn to the right side.  The right inframammary fold was opened down to underlying severe scar tissue.  I was able to release the anterior and posterior flaps to allow the nipple-areolar complex with __________ tissue.  I was then able to dissect down to the pectoralis major muscle and the capsule.  I was able to do extra capsular excision with the implant still intact throughout the lower portion of the dissection.  I then did a lift  on the right side.  Periareolar lift, de- epithelializing the skin and lifting up approximately 2.5 to 3 cm.  I dissected down through the skin, subcutaneous tissue, breast tissue to the underlying upper pole of the capsule using delicate dissection as well as the Metzenbaum scissors.  We were able to dissect around the whole capsule formation with the implant in side with good results. This was very tedious and very scarred.  The wound was irrigated with copious amounts of saline and then hemostasis was maintained with Bovie anticoagulation to ensure hemostasis.  The inframammary fold area was reclosed with 2-0 Monocryl x2 layers and a running  subcuticular stitch of 3-0 Monocryl.  The saline implant was placed in to that site and lot #[id number] and serial #[id number], mentor reference [phone], 800 mL total to that side with excellent symmetry as compared to the left side.  I was then able to close the remaining portion of the muscle left in the periareolar area doing the completely mastopexy with 2-0 and 3-0 Monocryl subcutaneously and then a running subcuticular stitch of 3-0 Monocryl.  The wounds were drained with #10 fully fluted Blake drain, was placed in the right lateral incision area, and brought out through the lateral portion incision secured with 3-0 Prolene after placing into the dead space.  The wounds were cleansed.  Steri-Strips, soft dressing applied to all the areas.  Nipple-areolar complex were examined with excellent blood supply.  Sterile dressing applied to all the areas including  Steri-Strips, 4 x 4s, ABDs, Hypafix tape.  Breast garment for support.  She tolerated all the procedures very well, and was taken to the recovery room in excellent condition.  ESTIMATED BLOOD LOSS:  150 mL.     Yaakov Guthrie. Shon Hough, M.D.     Cathie Hoops  D:  07/22/2013  T:  07/23/2013  Job:  [id number]

## 2013-08-22 ENCOUNTER — Other Ambulatory Visit: Payer: Self-pay

## 2015-06-30 DIAGNOSIS — I10 Essential (primary) hypertension: Secondary | ICD-10-CM | POA: Diagnosis not present

## 2015-06-30 DIAGNOSIS — E785 Hyperlipidemia, unspecified: Secondary | ICD-10-CM | POA: Diagnosis not present

## 2015-06-30 DIAGNOSIS — N951 Menopausal and female climacteric states: Secondary | ICD-10-CM | POA: Diagnosis not present

## 2015-06-30 DIAGNOSIS — H34839 Tributary (branch) retinal vein occlusion, unspecified eye: Secondary | ICD-10-CM | POA: Diagnosis not present

## 2015-07-01 DIAGNOSIS — H524 Presbyopia: Secondary | ICD-10-CM | POA: Diagnosis not present

## 2015-07-01 DIAGNOSIS — H5210 Myopia, unspecified eye: Secondary | ICD-10-CM | POA: Diagnosis not present

## 2015-07-14 DIAGNOSIS — Z23 Encounter for immunization: Secondary | ICD-10-CM | POA: Diagnosis not present

## 2015-07-14 DIAGNOSIS — H34839 Tributary (branch) retinal vein occlusion, unspecified eye: Secondary | ICD-10-CM | POA: Diagnosis not present

## 2015-07-14 DIAGNOSIS — E785 Hyperlipidemia, unspecified: Secondary | ICD-10-CM | POA: Diagnosis not present

## 2015-07-14 DIAGNOSIS — I10 Essential (primary) hypertension: Secondary | ICD-10-CM | POA: Diagnosis not present

## 2015-07-14 DIAGNOSIS — N951 Menopausal and female climacteric states: Secondary | ICD-10-CM | POA: Diagnosis not present

## 2015-09-14 DIAGNOSIS — R05 Cough: Secondary | ICD-10-CM | POA: Diagnosis not present

## 2016-02-17 DIAGNOSIS — H1010 Acute atopic conjunctivitis, unspecified eye: Secondary | ICD-10-CM | POA: Diagnosis not present

## 2016-02-17 DIAGNOSIS — I10 Essential (primary) hypertension: Secondary | ICD-10-CM | POA: Diagnosis not present

## 2016-03-15 DIAGNOSIS — H43811 Vitreous degeneration, right eye: Secondary | ICD-10-CM | POA: Diagnosis not present

## 2016-03-15 DIAGNOSIS — H348312 Tributary (branch) retinal vein occlusion, right eye, stable: Secondary | ICD-10-CM | POA: Diagnosis not present

## 2016-03-15 DIAGNOSIS — H4312 Vitreous hemorrhage, left eye: Secondary | ICD-10-CM | POA: Diagnosis not present

## 2016-03-15 DIAGNOSIS — H348321 Tributary (branch) retinal vein occlusion, left eye, with retinal neovascularization: Secondary | ICD-10-CM | POA: Diagnosis not present

## 2016-05-11 DIAGNOSIS — H348332 Tributary (branch) retinal vein occlusion, bilateral, stable: Secondary | ICD-10-CM | POA: Diagnosis not present

## 2016-05-11 DIAGNOSIS — H43812 Vitreous degeneration, left eye: Secondary | ICD-10-CM | POA: Diagnosis not present

## 2016-05-11 DIAGNOSIS — H2513 Age-related nuclear cataract, bilateral: Secondary | ICD-10-CM | POA: Diagnosis not present

## 2016-05-24 DIAGNOSIS — K621 Rectal polyp: Secondary | ICD-10-CM | POA: Diagnosis not present

## 2016-05-24 DIAGNOSIS — Z1211 Encounter for screening for malignant neoplasm of colon: Secondary | ICD-10-CM | POA: Diagnosis not present

## 2016-05-24 DIAGNOSIS — D120 Benign neoplasm of cecum: Secondary | ICD-10-CM | POA: Diagnosis not present

## 2016-05-24 DIAGNOSIS — K640 First degree hemorrhoids: Secondary | ICD-10-CM | POA: Diagnosis not present

## 2016-05-24 DIAGNOSIS — D125 Benign neoplasm of sigmoid colon: Secondary | ICD-10-CM | POA: Diagnosis not present

## 2016-05-24 DIAGNOSIS — K5730 Diverticulosis of large intestine without perforation or abscess without bleeding: Secondary | ICD-10-CM | POA: Diagnosis not present

## 2016-05-24 DIAGNOSIS — K635 Polyp of colon: Secondary | ICD-10-CM | POA: Diagnosis not present

## 2016-05-27 DIAGNOSIS — H4312 Vitreous hemorrhage, left eye: Secondary | ICD-10-CM | POA: Diagnosis not present

## 2016-05-27 DIAGNOSIS — H348312 Tributary (branch) retinal vein occlusion, right eye, stable: Secondary | ICD-10-CM | POA: Diagnosis not present

## 2016-05-27 DIAGNOSIS — H348321 Tributary (branch) retinal vein occlusion, left eye, with retinal neovascularization: Secondary | ICD-10-CM | POA: Diagnosis not present

## 2016-05-27 DIAGNOSIS — H35363 Drusen (degenerative) of macula, bilateral: Secondary | ICD-10-CM | POA: Diagnosis not present

## 2016-05-30 DIAGNOSIS — H4312 Vitreous hemorrhage, left eye: Secondary | ICD-10-CM | POA: Diagnosis not present

## 2016-05-30 DIAGNOSIS — H348312 Tributary (branch) retinal vein occlusion, right eye, stable: Secondary | ICD-10-CM | POA: Diagnosis not present

## 2016-05-30 DIAGNOSIS — H35363 Drusen (degenerative) of macula, bilateral: Secondary | ICD-10-CM | POA: Diagnosis not present

## 2016-05-30 DIAGNOSIS — H348321 Tributary (branch) retinal vein occlusion, left eye, with retinal neovascularization: Secondary | ICD-10-CM | POA: Diagnosis not present

## 2016-06-27 DIAGNOSIS — H5210 Myopia, unspecified eye: Secondary | ICD-10-CM | POA: Diagnosis not present

## 2016-06-27 DIAGNOSIS — H524 Presbyopia: Secondary | ICD-10-CM | POA: Diagnosis not present

## 2016-09-20 DIAGNOSIS — H02839 Dermatochalasis of unspecified eye, unspecified eyelid: Secondary | ICD-10-CM | POA: Diagnosis not present

## 2016-09-20 DIAGNOSIS — H2513 Age-related nuclear cataract, bilateral: Secondary | ICD-10-CM | POA: Diagnosis not present

## 2016-09-20 DIAGNOSIS — H25013 Cortical age-related cataract, bilateral: Secondary | ICD-10-CM | POA: Diagnosis not present

## 2016-09-20 DIAGNOSIS — H2512 Age-related nuclear cataract, left eye: Secondary | ICD-10-CM | POA: Diagnosis not present

## 2016-09-20 DIAGNOSIS — H18413 Arcus senilis, bilateral: Secondary | ICD-10-CM | POA: Diagnosis not present

## 2016-09-21 DIAGNOSIS — Z0000 Encounter for general adult medical examination without abnormal findings: Secondary | ICD-10-CM | POA: Diagnosis not present

## 2016-09-21 DIAGNOSIS — G4709 Other insomnia: Secondary | ICD-10-CM | POA: Diagnosis not present

## 2016-09-21 DIAGNOSIS — E785 Hyperlipidemia, unspecified: Secondary | ICD-10-CM | POA: Diagnosis not present

## 2016-09-21 DIAGNOSIS — I10 Essential (primary) hypertension: Secondary | ICD-10-CM | POA: Diagnosis not present

## 2016-10-31 DIAGNOSIS — H2512 Age-related nuclear cataract, left eye: Secondary | ICD-10-CM | POA: Diagnosis not present

## 2016-10-31 DIAGNOSIS — H52202 Unspecified astigmatism, left eye: Secondary | ICD-10-CM | POA: Diagnosis not present

## 2016-11-01 DIAGNOSIS — H2511 Age-related nuclear cataract, right eye: Secondary | ICD-10-CM | POA: Diagnosis not present

## 2016-11-08 DIAGNOSIS — R05 Cough: Secondary | ICD-10-CM | POA: Diagnosis not present

## 2016-11-08 DIAGNOSIS — J22 Unspecified acute lower respiratory infection: Secondary | ICD-10-CM | POA: Diagnosis not present

## 2016-11-14 DIAGNOSIS — H2511 Age-related nuclear cataract, right eye: Secondary | ICD-10-CM | POA: Diagnosis not present

## 2016-11-23 DIAGNOSIS — H2511 Age-related nuclear cataract, right eye: Secondary | ICD-10-CM | POA: Diagnosis not present

## 2017-04-12 DIAGNOSIS — H4312 Vitreous hemorrhage, left eye: Secondary | ICD-10-CM | POA: Diagnosis not present

## 2017-04-12 DIAGNOSIS — H348312 Tributary (branch) retinal vein occlusion, right eye, stable: Secondary | ICD-10-CM | POA: Diagnosis not present

## 2017-04-12 DIAGNOSIS — H43812 Vitreous degeneration, left eye: Secondary | ICD-10-CM | POA: Diagnosis not present

## 2017-04-12 DIAGNOSIS — H34812 Central retinal vein occlusion, left eye, with macular edema: Secondary | ICD-10-CM | POA: Diagnosis not present

## 2017-05-10 DIAGNOSIS — H348312 Tributary (branch) retinal vein occlusion, right eye, stable: Secondary | ICD-10-CM | POA: Diagnosis not present

## 2017-05-10 DIAGNOSIS — H43812 Vitreous degeneration, left eye: Secondary | ICD-10-CM | POA: Diagnosis not present

## 2017-05-10 DIAGNOSIS — H34812 Central retinal vein occlusion, left eye, with macular edema: Secondary | ICD-10-CM | POA: Diagnosis not present

## 2017-06-07 DIAGNOSIS — H43812 Vitreous degeneration, left eye: Secondary | ICD-10-CM | POA: Diagnosis not present

## 2017-06-07 DIAGNOSIS — H348312 Tributary (branch) retinal vein occlusion, right eye, stable: Secondary | ICD-10-CM | POA: Diagnosis not present

## 2017-06-07 DIAGNOSIS — H4312 Vitreous hemorrhage, left eye: Secondary | ICD-10-CM | POA: Diagnosis not present

## 2017-06-07 DIAGNOSIS — H34812 Central retinal vein occlusion, left eye, with macular edema: Secondary | ICD-10-CM | POA: Diagnosis not present

## 2017-08-02 DIAGNOSIS — H3582 Retinal ischemia: Secondary | ICD-10-CM | POA: Diagnosis not present

## 2017-08-02 DIAGNOSIS — H34812 Central retinal vein occlusion, left eye, with macular edema: Secondary | ICD-10-CM | POA: Diagnosis not present

## 2017-08-02 DIAGNOSIS — H348312 Tributary (branch) retinal vein occlusion, right eye, stable: Secondary | ICD-10-CM | POA: Diagnosis not present

## 2017-08-02 DIAGNOSIS — H35361 Drusen (degenerative) of macula, right eye: Secondary | ICD-10-CM | POA: Diagnosis not present

## 2017-10-25 DIAGNOSIS — H348312 Tributary (branch) retinal vein occlusion, right eye, stable: Secondary | ICD-10-CM | POA: Diagnosis not present

## 2017-10-25 DIAGNOSIS — H3582 Retinal ischemia: Secondary | ICD-10-CM | POA: Diagnosis not present

## 2017-10-25 DIAGNOSIS — H34812 Central retinal vein occlusion, left eye, with macular edema: Secondary | ICD-10-CM | POA: Diagnosis not present

## 2017-10-25 DIAGNOSIS — H4312 Vitreous hemorrhage, left eye: Secondary | ICD-10-CM | POA: Diagnosis not present

## 2017-11-08 DIAGNOSIS — Z0000 Encounter for general adult medical examination without abnormal findings: Secondary | ICD-10-CM | POA: Diagnosis not present

## 2017-11-08 DIAGNOSIS — I10 Essential (primary) hypertension: Secondary | ICD-10-CM | POA: Diagnosis not present

## 2017-11-08 DIAGNOSIS — R5383 Other fatigue: Secondary | ICD-10-CM | POA: Diagnosis not present

## 2017-11-08 DIAGNOSIS — M25511 Pain in right shoulder: Secondary | ICD-10-CM | POA: Diagnosis not present

## 2017-11-08 DIAGNOSIS — M542 Cervicalgia: Secondary | ICD-10-CM | POA: Diagnosis not present

## 2017-11-08 DIAGNOSIS — E785 Hyperlipidemia, unspecified: Secondary | ICD-10-CM | POA: Diagnosis not present

## 2017-11-08 DIAGNOSIS — D509 Iron deficiency anemia, unspecified: Secondary | ICD-10-CM | POA: Diagnosis not present

## 2017-11-08 DIAGNOSIS — G4709 Other insomnia: Secondary | ICD-10-CM | POA: Diagnosis not present

## 2017-11-08 DIAGNOSIS — R635 Abnormal weight gain: Secondary | ICD-10-CM | POA: Diagnosis not present

## 2017-11-09 ENCOUNTER — Ambulatory Visit
Admission: RE | Admit: 2017-11-09 | Discharge: 2017-11-09 | Disposition: A | Discharge status: Home / Self Care | Payer: Medicare HMO | Source: Ambulatory Visit | Attending: Family Medicine | Admitting: Family Medicine

## 2017-11-09 ENCOUNTER — Other Ambulatory Visit: Payer: Self-pay | Admitting: Family Medicine

## 2017-11-09 DIAGNOSIS — M19011 Primary osteoarthritis, right shoulder: Secondary | ICD-10-CM | POA: Diagnosis not present

## 2017-11-09 DIAGNOSIS — R52 Pain, unspecified: Secondary | ICD-10-CM

## 2017-11-09 DIAGNOSIS — M542 Cervicalgia: Secondary | ICD-10-CM | POA: Diagnosis not present

## 2017-12-05 DIAGNOSIS — M542 Cervicalgia: Secondary | ICD-10-CM | POA: Diagnosis not present

## 2017-12-15 DIAGNOSIS — M5412 Radiculopathy, cervical region: Secondary | ICD-10-CM | POA: Diagnosis not present

## 2017-12-15 DIAGNOSIS — M542 Cervicalgia: Secondary | ICD-10-CM | POA: Diagnosis not present

## 2017-12-15 DIAGNOSIS — M6281 Muscle weakness (generalized): Secondary | ICD-10-CM | POA: Diagnosis not present

## 2017-12-15 DIAGNOSIS — M2560 Stiffness of unspecified joint, not elsewhere classified: Secondary | ICD-10-CM | POA: Diagnosis not present

## 2017-12-20 DIAGNOSIS — M542 Cervicalgia: Secondary | ICD-10-CM | POA: Diagnosis not present

## 2017-12-20 DIAGNOSIS — M2560 Stiffness of unspecified joint, not elsewhere classified: Secondary | ICD-10-CM | POA: Diagnosis not present

## 2017-12-20 DIAGNOSIS — M6281 Muscle weakness (generalized): Secondary | ICD-10-CM | POA: Diagnosis not present

## 2017-12-20 DIAGNOSIS — M5412 Radiculopathy, cervical region: Secondary | ICD-10-CM | POA: Diagnosis not present

## 2017-12-22 DIAGNOSIS — M6281 Muscle weakness (generalized): Secondary | ICD-10-CM | POA: Diagnosis not present

## 2017-12-22 DIAGNOSIS — M5412 Radiculopathy, cervical region: Secondary | ICD-10-CM | POA: Diagnosis not present

## 2017-12-22 DIAGNOSIS — M542 Cervicalgia: Secondary | ICD-10-CM | POA: Diagnosis not present

## 2017-12-22 DIAGNOSIS — M2560 Stiffness of unspecified joint, not elsewhere classified: Secondary | ICD-10-CM | POA: Diagnosis not present

## 2017-12-27 DIAGNOSIS — M5412 Radiculopathy, cervical region: Secondary | ICD-10-CM | POA: Diagnosis not present

## 2017-12-27 DIAGNOSIS — M6281 Muscle weakness (generalized): Secondary | ICD-10-CM | POA: Diagnosis not present

## 2017-12-27 DIAGNOSIS — M2560 Stiffness of unspecified joint, not elsewhere classified: Secondary | ICD-10-CM | POA: Diagnosis not present

## 2017-12-27 DIAGNOSIS — M542 Cervicalgia: Secondary | ICD-10-CM | POA: Diagnosis not present

## 2017-12-28 DIAGNOSIS — M2560 Stiffness of unspecified joint, not elsewhere classified: Secondary | ICD-10-CM | POA: Diagnosis not present

## 2017-12-28 DIAGNOSIS — M6281 Muscle weakness (generalized): Secondary | ICD-10-CM | POA: Diagnosis not present

## 2017-12-28 DIAGNOSIS — M5412 Radiculopathy, cervical region: Secondary | ICD-10-CM | POA: Diagnosis not present

## 2017-12-28 DIAGNOSIS — M542 Cervicalgia: Secondary | ICD-10-CM | POA: Diagnosis not present

## 2018-01-03 DIAGNOSIS — M5412 Radiculopathy, cervical region: Secondary | ICD-10-CM | POA: Diagnosis not present

## 2018-01-03 DIAGNOSIS — M6281 Muscle weakness (generalized): Secondary | ICD-10-CM | POA: Diagnosis not present

## 2018-01-03 DIAGNOSIS — M2560 Stiffness of unspecified joint, not elsewhere classified: Secondary | ICD-10-CM | POA: Diagnosis not present

## 2018-01-03 DIAGNOSIS — M542 Cervicalgia: Secondary | ICD-10-CM | POA: Diagnosis not present

## 2018-01-10 DIAGNOSIS — M542 Cervicalgia: Secondary | ICD-10-CM | POA: Diagnosis not present

## 2018-01-10 DIAGNOSIS — M5412 Radiculopathy, cervical region: Secondary | ICD-10-CM | POA: Diagnosis not present

## 2018-01-10 DIAGNOSIS — M6281 Muscle weakness (generalized): Secondary | ICD-10-CM | POA: Diagnosis not present

## 2018-01-10 DIAGNOSIS — M2560 Stiffness of unspecified joint, not elsewhere classified: Secondary | ICD-10-CM | POA: Diagnosis not present

## 2018-01-17 DIAGNOSIS — M542 Cervicalgia: Secondary | ICD-10-CM | POA: Diagnosis not present

## 2018-01-17 DIAGNOSIS — M5412 Radiculopathy, cervical region: Secondary | ICD-10-CM | POA: Diagnosis not present

## 2018-01-17 DIAGNOSIS — M2560 Stiffness of unspecified joint, not elsewhere classified: Secondary | ICD-10-CM | POA: Diagnosis not present

## 2018-01-17 DIAGNOSIS — M6281 Muscle weakness (generalized): Secondary | ICD-10-CM | POA: Diagnosis not present

## 2018-01-24 DIAGNOSIS — M542 Cervicalgia: Secondary | ICD-10-CM | POA: Diagnosis not present

## 2018-01-24 DIAGNOSIS — M5412 Radiculopathy, cervical region: Secondary | ICD-10-CM | POA: Diagnosis not present

## 2018-01-24 DIAGNOSIS — M2560 Stiffness of unspecified joint, not elsewhere classified: Secondary | ICD-10-CM | POA: Diagnosis not present

## 2018-01-24 DIAGNOSIS — M6281 Muscle weakness (generalized): Secondary | ICD-10-CM | POA: Diagnosis not present

## 2018-04-25 DIAGNOSIS — H348312 Tributary (branch) retinal vein occlusion, right eye, stable: Secondary | ICD-10-CM | POA: Diagnosis not present

## 2018-04-25 DIAGNOSIS — H3582 Retinal ischemia: Secondary | ICD-10-CM | POA: Diagnosis not present

## 2018-04-25 DIAGNOSIS — H4312 Vitreous hemorrhage, left eye: Secondary | ICD-10-CM | POA: Diagnosis not present

## 2018-04-25 DIAGNOSIS — H34812 Central retinal vein occlusion, left eye, with macular edema: Secondary | ICD-10-CM | POA: Diagnosis not present

## 2018-05-04 DIAGNOSIS — H34812 Central retinal vein occlusion, left eye, with macular edema: Secondary | ICD-10-CM | POA: Diagnosis not present

## 2018-07-11 DIAGNOSIS — H43812 Vitreous degeneration, left eye: Secondary | ICD-10-CM | POA: Diagnosis not present

## 2018-07-11 DIAGNOSIS — H3582 Retinal ischemia: Secondary | ICD-10-CM | POA: Diagnosis not present

## 2018-07-11 DIAGNOSIS — H34812 Central retinal vein occlusion, left eye, with macular edema: Secondary | ICD-10-CM | POA: Diagnosis not present

## 2018-07-11 DIAGNOSIS — H348312 Tributary (branch) retinal vein occlusion, right eye, stable: Secondary | ICD-10-CM | POA: Diagnosis not present

## 2018-07-19 DIAGNOSIS — M25561 Pain in right knee: Secondary | ICD-10-CM | POA: Diagnosis not present

## 2018-07-19 DIAGNOSIS — Z96651 Presence of right artificial knee joint: Secondary | ICD-10-CM | POA: Diagnosis not present

## 2018-09-05 DIAGNOSIS — H3582 Retinal ischemia: Secondary | ICD-10-CM | POA: Diagnosis not present

## 2018-09-05 DIAGNOSIS — H348312 Tributary (branch) retinal vein occlusion, right eye, stable: Secondary | ICD-10-CM | POA: Diagnosis not present

## 2018-09-05 DIAGNOSIS — H35341 Macular cyst, hole, or pseudohole, right eye: Secondary | ICD-10-CM | POA: Diagnosis not present

## 2018-09-05 DIAGNOSIS — H34812 Central retinal vein occlusion, left eye, with macular edema: Secondary | ICD-10-CM | POA: Diagnosis not present

## 2018-09-28 DIAGNOSIS — H524 Presbyopia: Secondary | ICD-10-CM | POA: Diagnosis not present

## 2019-01-11 ENCOUNTER — Other Ambulatory Visit: Payer: Self-pay | Admitting: Family Medicine

## 2019-01-11 DIAGNOSIS — M810 Age-related osteoporosis without current pathological fracture: Secondary | ICD-10-CM

## 2019-04-26 ENCOUNTER — Other Ambulatory Visit: Payer: Self-pay | Admitting: Family Medicine

## 2019-04-26 DIAGNOSIS — Z13820 Encounter for screening for osteoporosis: Secondary | ICD-10-CM

## 2019-04-26 DIAGNOSIS — E2839 Other primary ovarian failure: Secondary | ICD-10-CM

## 2019-05-01 ENCOUNTER — Other Ambulatory Visit: Payer: Self-pay

## 2019-05-01 ENCOUNTER — Ambulatory Visit
Admission: RE | Admit: 2019-05-01 | Discharge: 2019-05-01 | Disposition: A | Discharge status: Home / Self Care | Payer: Medicare Other | Source: Ambulatory Visit | Attending: Family Medicine | Admitting: Family Medicine

## 2019-05-01 DIAGNOSIS — Z13820 Encounter for screening for osteoporosis: Secondary | ICD-10-CM

## 2019-05-01 DIAGNOSIS — E2839 Other primary ovarian failure: Secondary | ICD-10-CM

## 2019-07-22 ENCOUNTER — Other Ambulatory Visit: Payer: Self-pay

## 2019-07-22 ENCOUNTER — Ambulatory Visit (INDEPENDENT_AMBULATORY_CARE_PROVIDER_SITE_OTHER): Payer: Medicare Other | Admitting: Bariatrics

## 2019-07-22 ENCOUNTER — Encounter: Payer: Self-pay | Admitting: Bariatrics

## 2019-07-22 ENCOUNTER — Encounter (INDEPENDENT_AMBULATORY_CARE_PROVIDER_SITE_OTHER): Payer: Self-pay | Admitting: Bariatrics

## 2019-07-22 VITALS — BP 150/78 | HR 64 | Temp 98.2°F | Ht 64.0 in | Wt 179.0 lb

## 2019-07-22 DIAGNOSIS — M1990 Unspecified osteoarthritis, unspecified site: Secondary | ICD-10-CM

## 2019-07-22 DIAGNOSIS — I10 Essential (primary) hypertension: Secondary | ICD-10-CM

## 2019-07-22 DIAGNOSIS — E7849 Other hyperlipidemia: Secondary | ICD-10-CM | POA: Diagnosis not present

## 2019-07-22 DIAGNOSIS — R0602 Shortness of breath: Secondary | ICD-10-CM

## 2019-07-22 DIAGNOSIS — Z6830 Body mass index (BMI) 30.0-30.9, adult: Secondary | ICD-10-CM | POA: Diagnosis not present

## 2019-07-22 DIAGNOSIS — Z1331 Encounter for screening for depression: Secondary | ICD-10-CM | POA: Diagnosis not present

## 2019-07-22 DIAGNOSIS — E669 Obesity, unspecified: Secondary | ICD-10-CM

## 2019-07-22 DIAGNOSIS — R5383 Other fatigue: Secondary | ICD-10-CM

## 2019-07-22 DIAGNOSIS — L906 Striae atrophicae: Secondary | ICD-10-CM

## 2019-07-22 DIAGNOSIS — Z0289 Encounter for other administrative examinations: Secondary | ICD-10-CM

## 2019-07-22 DIAGNOSIS — K219 Gastro-esophageal reflux disease without esophagitis: Secondary | ICD-10-CM | POA: Insufficient documentation

## 2019-07-22 DIAGNOSIS — E559 Vitamin D deficiency, unspecified: Secondary | ICD-10-CM

## 2019-07-23 ENCOUNTER — Encounter (INDEPENDENT_AMBULATORY_CARE_PROVIDER_SITE_OTHER): Payer: Self-pay | Admitting: Bariatrics

## 2019-07-23 DIAGNOSIS — R7303 Prediabetes: Secondary | ICD-10-CM

## 2019-07-23 HISTORY — DX: Prediabetes: R73.03

## 2019-07-23 LAB — T3: T3, Total: 129 ng/dL (ref 71–180)

## 2019-07-23 LAB — COMPREHENSIVE METABOLIC PANEL
ALT: 32 IU/L (ref 0–32)
AST: 30 IU/L (ref 0–40)
Albumin/Globulin Ratio: 1.7 (ref 1.2–2.2)
Albumin: 4.7 g/dL (ref 3.8–4.8)
Alkaline Phosphatase: 100 IU/L (ref 39–117)
BUN/Creatinine Ratio: 14 (ref 12–28)
BUN: 11 mg/dL (ref 8–27)
Bilirubin Total: 0.3 mg/dL (ref 0.0–1.2)
CO2: 21 mmol/L (ref 20–29)
Calcium: 9.4 mg/dL (ref 8.7–10.3)
Chloride: 102 mmol/L (ref 96–106)
Creatinine, Ser: 0.8 mg/dL (ref 0.57–1.00)
GFR calc Af Amer: 87 mL/min/{1.73_m2} (ref >59)
GFR calc non Af Amer: 75 mL/min/{1.73_m2} (ref >59)
Globulin, Total: 2.7 g/dL (ref 1.5–4.5)
Glucose: 116 mg/dL — ABNORMAL HIGH (ref 65–99)
Potassium: 4.1 mmol/L (ref 3.5–5.2)
Sodium: 139 mmol/L (ref 134–144)
Total Protein: 7.4 g/dL (ref 6.0–8.5)

## 2019-07-23 LAB — LIPID PANEL WITH LDL/HDL RATIO
Cholesterol, Total: 261 mg/dL — ABNORMAL HIGH (ref 100–199)
HDL: 62 mg/dL (ref >39)
LDL Chol Calc (NIH): 171 mg/dL — ABNORMAL HIGH (ref 0–99)
LDL/HDL Ratio: 2.8 ratio (ref 0.0–3.2)
Triglycerides: 157 mg/dL — ABNORMAL HIGH (ref 0–149)
VLDL Cholesterol Cal: 28 mg/dL (ref 5–40)

## 2019-07-23 LAB — T4, FREE: Free T4: 1.22 ng/dL (ref 0.82–1.77)

## 2019-07-23 LAB — HEMOGLOBIN A1C
Est. average glucose Bld gHb Est-mCnc: 123 mg/dL
Hgb A1c MFr Bld: 5.9 % — ABNORMAL HIGH (ref 4.8–5.6)

## 2019-07-23 LAB — TSH: TSH: 1.22 u[IU]/mL (ref 0.450–4.500)

## 2019-07-23 LAB — CORTISOL: Cortisol: 5.9 ug/dL

## 2019-07-23 LAB — INSULIN, RANDOM: INSULIN: 16.2 u[IU]/mL (ref 2.6–24.9)

## 2019-07-23 LAB — VITAMIN D 25 HYDROXY (VIT D DEFICIENCY, FRACTURES): Vit D, 25-Hydroxy: 41.3 ng/mL (ref 30.0–100.0)

## 2019-07-23 NOTE — Progress Notes (Signed)
Office: [phone]  /  Fax: [phone]   Dear Dr. Curly Rim,   Thank you for referring Skyylar Frate to our clinic. The following note includes my evaluation and treatment recommendations.  HPI:   Chief Complaint: OBESITY    Addelin Saenz has been referred by Adele Barthel. Masneri, DO for consultation regarding her obesity and obesity related comorbidities.    Tashieka Osthoff (MR# [id number]) is a 68 y.o. female who presents on 07/22/2019 for obesity evaluation and treatment. Current BMI is Body mass index is 30.73 kg/m.Remo Lipps has been struggling with her weight for many years and has been unsuccessful in either losing weight, maintaining weight loss, or reaching her healthy weight goal.     Kinsie attended our information session and states she is currently in the action stage of change and ready to dedicate time achieving and maintaining a healthier weight. Tharon is interested in becoming our patient and working on intensive lifestyle modifications including (but not limited to) diet, exercise and weight loss.    Caleigha states her family eats meals together her desired weight loss is 14 to 19 lbs. She has a goal of 160 lbs. she started gaining weight during menopause she is a picky eater and doesn't like to eat healthier foods  she craves salty foods (chips or pork rinds), chocolate and seafood she snacks frequently in the evenings she skips meals frequently she is frequently drinking liquids with calories she frequently makes poor food choices she has problems with excessive hunger  she frequently eats larger portions than normal  she struggles with emotional eating    Fatigue Alanya feels her energy is lower than it should be. This has worsened with weight gain and has not worsened recently. Irja admits to daytime somnolence and she admits to waking up still tired. Patient is at risk for obstructive sleep apnea. Patent has a history of symptoms of daytime fatigue, morning fatigue and  hypertension. Patient generally gets 4 or 5 hours of sleep per night, and states they generally have restless sleep. Snoring is present. Apneic episodes are not present. Epworth Sleepiness Score is 4  Dyspnea on exertion Remo Lipps notes increasing shortness of breath with certain activities and seems to be worsening over time with weight gain. She notes getting out of breath sooner with activity than she used to. This has not gotten worse recently. Queenie denies orthopnea.  Hypertension Jearline Lapan is a 68 y.o. female with hypertension. She is taking Norvasc and Dyazide. Keyari Logiudice denies chest pain. She is working weight loss to help control her blood pressure with the goal of decreasing her risk of heart attack and stroke. Joans blood pressure is not well controlled.  Osteoarthritis Weatherly has a diagnosis of osteoarthritis and she is taking Diclofenac.  Hyperlipidemia Twilia has hyperlipidemia and she is taking Lipitor. She is attempting to improve her cholesterol levels with intensive lifestyle modification including a low saturated fat diet, exercise and weight loss. She denies myalgias.  Vitamin D deficiency Deliza has a diagnosis of vitamin D deficiency. She is currently taking OTC vit D 2,000 IU and her last value was approximately one month ago. She denies nausea, vomiting or muscle weakness.  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 She is positive for abnormal fats.  Depression Screen Carlinda's Food and Mood (modified PHQ-9) score was  Depression screen PHQ 2/9 07/22/2019  Decreased Interest 2  Down, Depressed, Hopeless 1  PHQ - 2 Score 3  Altered sleeping 3  Tired, decreased energy 3  Change in appetite 1  Feeling bad or failure about yourself  0  Trouble concentrating 0  Moving slowly or fidgety/restless 0  Suicidal thoughts 0  PHQ-9 Score 10  Difficult doing work/chores Not difficult at all    ASSESSMENT AND PLAN:  Other fatigue - Plan: EKG 12-Lead, T3, T4, free, TSH,  Hemoglobin A1c, Insulin, random, Comprehensive metabolic panel  Shortness of breath on exertion  Essential hypertension  Other hyperlipidemia - Plan: Lipid Panel With LDL/HDL Ratio  Vitamin D deficiency - Plan: VITAMIN D 25 Hydroxy (Vit-D Deficiency, Fractures)  Other type of osteoarthritis, unspecified site  Striae - Plan: Cortisol  Depression screening  Class 1 obesity with serious comorbidity and body mass index (BMI) of 30.0 to 30.9 in adult, unspecified obesity type  PLAN:  Fatigue Shaqueta was informed that her fatigue may be related to obesity, depression or many other causes. Labs will be ordered, and in the meanwhile Khadijah has agreed to work on diet, exercise and weight loss to help with fatigue. Proper sleep hygiene was discussed including the need for 7-8 hours of quality sleep each night. A sleep study was not ordered based on symptoms and Epworth score.  Dyspnea on exertion Kenna's shortness of breath appears to be obesity related and exercise induced. She has agreed to work on weight loss and gradually increase activity to treat her exercise induced shortness of breath. If Tuyet follows our instructions and loses weight without improvement of her shortness of breath, we will plan to refer to pulmonology. We will monitor this condition regularly. Maryagnes agrees to this plan.  Hypertension We discussed sodium restriction, working on healthy weight loss, and a regular exercise program as the means to achieve improved blood pressure control. Ayeisha agreed with this plan and agreed to follow up as directed. We will continue to monitor her blood pressure as well as her progress with the above lifestyle modifications. She will continue her medications as prescribed and will watch for signs of hypotension as she continues her lifestyle modifications.  Osteoarthritis Habibah will gradually increase exercise and she will do no pounding exercises.  Hyperlipidemia Suha was informed of the  American Heart Association Guidelines emphasizing intensive lifestyle modifications as the first line treatment for hyperlipidemia. We discussed many lifestyle modifications today in depth, and Tereva will work on decreasing saturated fats such as fatty red meat, butter and many fried foods. She will also increase vegetables and lean protein in her diet and work on exercise and weight loss efforts. Margrete will continue her medications and follow up as directed.  Vitamin D Deficiency Katosha was informed that low vitamin D levels contributes to fatigue and are associated with obesity, breast, and colon cancer. Charnika will continue OTC vitamin D @2 ,000 IU daily and she will follow up for routine testing of vitamin D, at least 2-3 times per year. She was informed of the risk of over-replacement of vitamin D and agrees to not increase her dose unless she discusses this with [place] first. We will check vitamin D level and Shirell will follow up as directed.  Striae  We will check cortisol and Kehly will follow up with our clinic in 2 weeks.  Depression Screen Verlena had a moderately positive depression screening. Depression is commonly associated with obesity and often results in emotional eating behaviors. We will monitor this closely and work on CBT to help improve the non-hunger eating patterns. Referral to Psychology may be required if no improvement is seen as she continues in our  clinic.  Obesity Esmi is currently in the action stage of change and her goal is to continue with weight loss efforts. I recommend Areesha begin the structured treatment plan as follows:  She has agreed to follow the Category 1 plan  Antwanique will continue to walk for weight loss and overall health benefits. We discussed the following Behavioral Modification Strategies today: planning for success, increase H2O intake, no skipping meals, keeping healthy foods in the home, increasing lean protein intake, decreasing simple carbohydrates, increasing  vegetables, decrease eating out and work on meal planning and intentional eating We will repeat indirect calorimetry in the near future.   She was informed of the importance of frequent follow up visits to maximize her success with intensive lifestyle modifications for her multiple health conditions. She was informed we would discuss her lab results at her next visit unless there is a critical issue that needs to be addressed sooner. Kiowa agreed to keep her next visit at the agreed upon time to discuss these results.  ALLERGIES: No Known Allergies  MEDICATIONS: Current Outpatient Medications on File Prior to Visit  Medication Sig Dispense Refill   amLODipine (NORVASC) 5 MG tablet Take 5 mg by mouth daily.     atorvastatin (LIPITOR) 20 MG tablet Take 20 mg by mouth daily.     Calcium Carbonate-Vit D-Min (CALCIUM 1200 PO) Take by mouth.     Cholecalciferol (VITAMIN D3) 50 MCG (2000 UT) capsule Take 2,000 Units by mouth daily.     Coenzyme Q10 (CO Q-10) 100 MG CAPS Take by mouth.     diclofenac (VOLTAREN) 75 MG EC tablet Take 75 mg by mouth 2 (two) times daily.     loratadine (CLARITIN) 10 MG tablet Take 10 mg by mouth daily.     magnesium oxide (HM MAGNESIUM) 400 MG tablet Take 400 mg by mouth daily. 400 - 800 mg     traZODone (DESYREL) 50 MG tablet Take 50 mg by mouth at bedtime.     triamterene-hydrochlorothiazide (DYAZIDE) 37.5-25 MG per capsule Take 1 capsule by mouth every morning.     No current facility-administered medications on file prior to visit.     PAST MEDICAL HISTORY: Past Medical History:  Diagnosis Date   Complication of breast implant 06/2013   Constipation    Food allergy    GERD (gastroesophageal reflux disease)    GERD (gastroesophageal reflux disease)    High cholesterol    Hypertension    under control with med., has been on med. x 5 yr.   Joint pain    Osteoarthritis of both knees    PONV (postoperative nausea and vomiting)     Seasonal allergies    Sensitive skin     PAST SURGICAL HISTORY: Past Surgical History:  Procedure Laterality Date   ABDOMINAL HYSTERECTOMY     partial - age 25   APPENDECTOMY     BREAST BIOPSY     x 2   BREAST CAPSULECTOMY WITH IMPLANT EXCHANGE Right 09/17/2007   with exc. accessory breast tissue right breast and axilla   BREAST CAPSULECTOMY WITH IMPLANT EXCHANGE Bilateral 07/22/2013   Procedure: BILATERAL BREAST CAPSULECTOMY WITH IMPLANT EXCHANGE AND RIGHT RECONSTRUCTION/REVISION;  Surgeon: Cristine Polio, MD;  Location: [facility];  Service: Plastics;  Laterality: Bilateral;   KNEE ARTHROSCOPY Right 09/12/2001   MASTECTOMY Bilateral    PLACEMENT OF BREAST IMPLANTS     after mastectomy   REMOVAL OF BILATERAL TISSUE EXPANDERS WITH PLACEMENT OF BILATERAL BREAST IMPLANTS  TONSILLECTOMY     age 3   TOTAL KNEE ARTHROPLASTY Right 10/25/2002    SOCIAL HISTORY: Social History   Tobacco Use   Smoking status: Never Smoker   Smokeless tobacco: Never Used  Substance Use Topics   Alcohol use: No   Drug use: No    FAMILY HISTORY: Family History  Problem Relation Age of Onset   Diabetes Mother    High blood pressure Mother    Anxiety disorder Mother    Heart disease Father     ROS: Review of Systems  Constitutional: Positive for malaise/fatigue.  Respiratory: Positive for shortness of breath (on exertion).   Cardiovascular: Negative for chest pain and orthopnea.  Gastrointestinal: Negative for nausea and vomiting.  Musculoskeletal: Negative for myalgias.       Negative for muscle weakness    PHYSICAL EXAM: Blood pressure (!) 150/78, pulse 64, temperature 98.2 F (36.8 C), temperature source Oral, height 5\' 4"  (1.626 m), weight 179 lb (81.2 kg), SpO2 99 %. Body mass index is 30.73 kg/m. Physical Exam Vitals signs reviewed.  Constitutional:      Appearance: Normal appearance. She is well-developed. She is obese.  HENT:     Head:  Normocephalic and atraumatic.     Nose: Nose normal.  Eyes:     General: No scleral icterus.    Extraocular Movements: Extraocular movements intact.  Neck:     Musculoskeletal: Normal range of motion and neck supple.     Thyroid: No thyromegaly.  Cardiovascular:     Rate and Rhythm: Normal rate and regular rhythm.  Pulmonary:     Effort: Pulmonary effort is normal. No respiratory distress.  Abdominal:     Palpations: Abdomen is soft.     Tenderness: There is no abdominal tenderness.  Musculoskeletal: Normal range of motion.     Comments: Range of Motion normal in all 4 extremities  Skin:    General: Skin is warm and dry.  Neurological:     Mental Status: She is alert and oriented to person, place, and time.     Coordination: Coordination normal.  Psychiatric:        Mood and Affect: Mood normal.        Behavior: Behavior normal.     RECENT LABS AND TESTS: BMET    Component Value Date/Time   NA 139 07/22/2019 1037   K 4.1 07/22/2019 1037   CL 102 07/22/2019 1037   CO2 21 07/22/2019 1037   GLUCOSE 116 (H) 07/22/2019 1037   BUN 11 07/22/2019 1037   CREATININE 0.80 07/22/2019 1037   CALCIUM 9.4 07/22/2019 1037   GFRNONAA 75 07/22/2019 1037   GFRAA 87 07/22/2019 1037   Lab Results  Component Value Date   HGBA1C 5.9 (H) 07/22/2019   Lab Results  Component Value Date   INSULIN 16.2 07/22/2019   CBC    Component Value Date/Time   HGB 13.8 07/22/2013 0718   Iron/TIBC/Ferritin/ %Sat No results found for: IRON, TIBC, FERRITIN, IRONPCTSAT Lipid Panel     Component Value Date/Time   CHOL 261 (H) 07/22/2019 1037   TRIG 157 (H) 07/22/2019 1037   HDL 62 07/22/2019 1037   LDLCALC 171 (H) 07/22/2019 1037   Hepatic Function Panel     Component Value Date/Time   PROT 7.4 07/22/2019 1037   ALBUMIN 4.7 07/22/2019 1037   AST 30 07/22/2019 1037   ALT 32 07/22/2019 1037   ALKPHOS 100 07/22/2019 1037   BILITOT 0.3 07/22/2019 1037  Component Value Date/Time    TSH 1.220 07/22/2019 1037    ECG  shows NSR with a rate of 72 BPM INDIRECT CALORIMETER done today shows a VO2 of 170 and a REE of 1183.  Her calculated basal metabolic rate is [id number] thus her basal metabolic rate is worse than expected. Her metabolism is low, but this may be related to a recent fast.     OBESITY BEHAVIORAL INTERVENTION VISIT  Today's visit was # 1   Starting weight: 179 lbs Starting date: 07/22/2019 Today's weight : 179 lbs  Today's date: 07/22/2019 Total lbs lost to date: 0    07/22/2019  Height 5\' 4"  (1.626 m)  Weight 179 lb (81.2 kg)  BMI (Calculated) 30.71  BLOOD PRESSURE - SYSTOLIC Q000111Q  BLOOD PRESSURE - DIASTOLIC 78  Waist Measurement  41 inches   Body Fat % 43.6 %  Total Body Water (lbs) 67 lbs  RMR 1183    ASK: We discussed the diagnosis of obesity with Rickey Primus today and Dashira agreed to give [place] permission to discuss obesity behavioral modification therapy today.  ASSESS: Lillah has the diagnosis of obesity and her BMI today is 30.71 Sheccid is in the action stage of change   ADVISE: Analize was educated on the multiple health risks of obesity as well as the benefit of weight loss to improve her health. She was advised of the need for long term treatment and the importance of lifestyle modifications to improve her current health and to decrease her risk of future health problems.  AGREE: Multiple dietary modification options and treatment options were discussed and  Yina agreed to follow the recommendations documented in the above note.  ARRANGE: Cameryn was educated on the importance of frequent visits to treat obesity as outlined per CMS and USPSTF guidelines and agreed to schedule her next follow up appointment today.  Corey Skains, am acting as Location manager for [employer]. Owens Shark, DO  I have reviewed the above documentation for accuracy and completeness, and I agree with the above. -Jearld Lesch, DO

## 2019-07-24 ENCOUNTER — Encounter (INDEPENDENT_AMBULATORY_CARE_PROVIDER_SITE_OTHER): Payer: Self-pay | Admitting: Bariatrics

## 2019-08-05 ENCOUNTER — Other Ambulatory Visit: Payer: Self-pay

## 2019-08-05 ENCOUNTER — Encounter (INDEPENDENT_AMBULATORY_CARE_PROVIDER_SITE_OTHER): Payer: Self-pay | Admitting: Bariatrics

## 2019-08-05 ENCOUNTER — Ambulatory Visit (INDEPENDENT_AMBULATORY_CARE_PROVIDER_SITE_OTHER): Payer: Medicare Other | Admitting: Bariatrics

## 2019-08-05 VITALS — BP 101/66 | HR 82 | Temp 97.7°F | Ht 64.0 in | Wt 175.0 lb

## 2019-08-05 DIAGNOSIS — E559 Vitamin D deficiency, unspecified: Secondary | ICD-10-CM

## 2019-08-05 DIAGNOSIS — Z6830 Body mass index (BMI) 30.0-30.9, adult: Secondary | ICD-10-CM | POA: Diagnosis not present

## 2019-08-05 DIAGNOSIS — E669 Obesity, unspecified: Secondary | ICD-10-CM

## 2019-08-05 DIAGNOSIS — E7849 Other hyperlipidemia: Secondary | ICD-10-CM | POA: Diagnosis not present

## 2019-08-05 DIAGNOSIS — R7303 Prediabetes: Secondary | ICD-10-CM | POA: Diagnosis not present

## 2019-08-05 MED ORDER — VITAMIN D (ERGOCALCIFEROL) 1.25 MG (50000 UNIT) PO CAPS: 50000.0000 [IU] | ORAL_CAPSULE | ORAL | 0 refills | Status: DC

## 2019-08-06 NOTE — Progress Notes (Signed)
Office: [phone]  /  Fax: [phone]   HPI:   Chief Complaint: OBESITY [name] is here to discuss her progress with her obesity treatment plan. She is on the Category 1 plan and is following her eating plan approximately 80-85% of the time. She states she is walking 0.5-1.5 miles 20-30 minutes 3-4 times per week. [name] is down 4 lbs. She occasionally ate something that she did not think was correct. Her weight is 175 lb (79.4 kg) today and has had a weight loss of 4 pounds over a period of 2 weeks since her last visit. She has lost 4 lbs since starting treatment with [place].  Pre-Diabetes [name] has a new diagnosis of prediabetes based on her elevated Hgb A1c and was informed this puts her at greater risk of developing diabetes. Last A1c 5.9 on 07/22/2019 with an insulin of 16.2. She is not taking metformin currently and continues to work on diet and exercise to decrease risk of diabetes. She denies nausea or hypoglycemia.  Hyperlipidemia [name] has hyperlipidemia and is taking Lipitor. She has been trying to improve her cholesterol levels with intensive lifestyle modification including a low saturated fat diet, exercise and weight loss. Last total cholesterol was 261 on 07/22/2019 with LDL 171 and triglycerides 157. Starlena states that lipids have been higher in the past. She denies any chest pain, claudication or myalgias.  Vitamin D deficiency [name] has a diagnosis of Vitamin D deficiency. She is not currently taking Vit D and denies nausea, vomiting or muscle weakness.  ASSESSMENT AND PLAN:  Prediabetes  Other hyperlipidemia  Vitamin D deficiency - Plan: Vitamin D, Ergocalciferol, (DRISDOL) 1.25 MG (50000 UT) CAPS capsule  Class 1 obesity with serious comorbidity and body mass index (BMI) of 30.0 to 30.9 in adult, unspecified obesity type  PLAN:  Pre-Diabetes [name] will continue to work on weight loss, exercise, and decreasing simple carbohydrates in her diet to help decrease the risk of  diabetes. We dicussed metformin including benefits and risks. She was informed that eating too many simple carbohydrates or too many calories at one sitting increases the likelihood of GI side effects. [name] was instructed to decrease carbohydrates, increase protein, increase healthy fats, and continue activity. She will follow-up with [place] as directed to monitor her progress.  Hyperlipidemia [name] was informed of the American Heart Association Guidelines emphasizing intensive lifestyle modifications as the first line treatment for hyperlipidemia. We discussed many lifestyle modifications today in depth, and [name] will continue to work on decreasing saturated fats such as fatty red meat, butter and many fried foods. [name] will continue her medications and will begin CoQ-10 100 mg. She will also increase vegetables and lean protein in her diet and continue to work on exercise and weight loss efforts.  Vitamin D Deficiency [name] was informed that low Vitamin D levels contributes to fatigue and are associated with obesity, breast, and colon cancer. She will take prescription Vit D @ 50,000 IU every week #4 with 0 refills and will follow-up for routine testing of Vitamin D, at least 2-3 times per year. She was informed of the risk of over-replacement of Vitamin D and agrees to not increase her dose unless she discusses this with [place] first. [name] agrees to follow-up with our clinic in 2 weeks.  Obesity [name] is currently in the action stage of change. As such, her goal is to continue with weight loss efforts. She has agreed to follow the Category 1 plan with additional lunch options. [name] will work  on meal planning and intentional eating. She can have a Fiber One bar for a snack. [name] has been instructed to work up to a goal of 150 minutes of combined cardio and strengthening exercise per week for weight loss and overall health benefits. We discussed the following Behavioral Modification Strategies today: increasing  lean protein intake, decreasing simple carbohydrates, increasing vegetables, increase H20 intake, decrease eating out, no skipping meals, work on meal planning and easy cooking plans, keeping healthy foods in the home, and planning for success.  [name] has agreed to follow-up with our clinic in 2 weeks. She was informed of the importance of frequent follow-up visits to maximize her success with intensive lifestyle modifications for her multiple health conditions.  ALLERGIES: No Known Allergies  MEDICATIONS: Current Outpatient Medications on File Prior to Visit  Medication Sig Dispense Refill  . amLODipine (NORVASC) 5 MG tablet Take 5 mg by mouth daily.    Marland Kitchen atorvastatin (LIPITOR) 20 MG tablet Take 20 mg by mouth daily.    . Calcium Carbonate-Vit D-Min (CALCIUM 1200 PO) Take by mouth.    . Cholecalciferol (VITAMIN D3) 50 MCG (2000 UT) capsule Take 2,000 Units by mouth daily.    . Coenzyme Q10 (CO Q-10) 100 MG CAPS Take by mouth.    . diclofenac (VOLTAREN) 75 MG EC tablet Take 75 mg by mouth 2 (two) times daily.    Marland Kitchen loratadine (CLARITIN) 10 MG tablet Take 10 mg by mouth daily.    . magnesium oxide (HM MAGNESIUM) 400 MG tablet Take 400 mg by mouth daily. 400 - 800 mg    . traZODone (DESYREL) 50 MG tablet Take 50 mg by mouth at bedtime.    . triamterene-hydrochlorothiazide (DYAZIDE) 37.5-25 MG per capsule Take 1 capsule by mouth every morning.     No current facility-administered medications on file prior to visit.     PAST MEDICAL HISTORY: Past Medical History:  Diagnosis Date  . Complication of breast implant 06/2013  . Constipation   . Food allergy   . GERD (gastroesophageal reflux disease)   . GERD (gastroesophageal reflux disease)   . High cholesterol   . Hypertension    under control with med., has been on med. x 5 yr.  . Joint pain   . Osteoarthritis of both knees   . PONV (postoperative nausea and vomiting)   . Seasonal allergies   . Sensitive skin     PAST SURGICAL  HISTORY: Past Surgical History:  Procedure Laterality Date  . ABDOMINAL HYSTERECTOMY     partial - age 38  . APPENDECTOMY    . BREAST BIOPSY     x 2  . BREAST CAPSULECTOMY WITH IMPLANT EXCHANGE Right 09/17/2007   with exc. accessory breast tissue right breast and axilla  . BREAST CAPSULECTOMY WITH IMPLANT EXCHANGE Bilateral 07/22/2013   Procedure: BILATERAL BREAST CAPSULECTOMY WITH IMPLANT EXCHANGE AND RIGHT RECONSTRUCTION/REVISION;  Surgeon: Cristine Polio, MD;  Location: [facility];  Service: Plastics;  Laterality: Bilateral;  . KNEE ARTHROSCOPY Right 09/12/2001  . MASTECTOMY Bilateral   . PLACEMENT OF BREAST IMPLANTS     after mastectomy  . REMOVAL OF BILATERAL TISSUE EXPANDERS WITH PLACEMENT OF BILATERAL BREAST IMPLANTS    . TONSILLECTOMY     age 45  . TOTAL KNEE ARTHROPLASTY Right 10/25/2002    SOCIAL HISTORY: Social History   Tobacco Use  . Smoking status: Never Smoker  . Smokeless tobacco: Never Used  Substance Use Topics  . Alcohol use: No  .  Drug use: No    FAMILY HISTORY: Family History  Problem Relation Age of Onset  . Diabetes Mother   . High blood pressure Mother   . Anxiety disorder Mother   . Heart disease Father    ROS: Review of Systems  Cardiovascular: Negative for chest pain and claudication.  Gastrointestinal: Negative for nausea and vomiting.  Musculoskeletal: Negative for myalgias.       Negative for muscle weakness.  Endo/Heme/Allergies:       Negative for hypoglycemia.   PHYSICAL EXAM: Blood pressure 101/66, pulse 82, temperature 97.7 F (36.5 C), temperature source Oral, height 5\' 4"  (1.626 m), weight 175 lb (79.4 kg), SpO2 98 %. Body mass index is 30.04 kg/m. Physical Exam Vitals signs reviewed.  Constitutional:      Appearance: Normal appearance. She is obese.  Cardiovascular:     Rate and Rhythm: Normal rate.     Pulses: Normal pulses.  Pulmonary:     Effort: Pulmonary effort is normal.     Breath sounds: Normal  breath sounds.  Musculoskeletal: Normal range of motion.  Skin:    General: Skin is warm and dry.  Neurological:     Mental Status: She is alert and oriented to person, place, and time.  Psychiatric:        Behavior: Behavior normal.   RECENT LABS AND TESTS: BMET    Component Value Date/Time   NA 139 07/22/2019 1037   K 4.1 07/22/2019 1037   CL 102 07/22/2019 1037   CO2 21 07/22/2019 1037   GLUCOSE 116 (H) 07/22/2019 1037   BUN 11 07/22/2019 1037   CREATININE 0.80 07/22/2019 1037   CALCIUM 9.4 07/22/2019 1037   GFRNONAA 75 07/22/2019 1037   GFRAA 87 07/22/2019 1037   Lab Results  Component Value Date   HGBA1C 5.9 (H) 07/22/2019   Lab Results  Component Value Date   INSULIN 16.2 07/22/2019   CBC    Component Value Date/Time   HGB 13.8 07/22/2013 0718   Iron/TIBC/Ferritin/ %Sat No results found for: IRON, TIBC, FERRITIN, IRONPCTSAT Lipid Panel     Component Value Date/Time   CHOL 261 (H) 07/22/2019 1037   TRIG 157 (H) 07/22/2019 1037   HDL 62 07/22/2019 1037   LDLCALC 171 (H) 07/22/2019 1037   Hepatic Function Panel     Component Value Date/Time   PROT 7.4 07/22/2019 1037   ALBUMIN 4.7 07/22/2019 1037   AST 30 07/22/2019 1037   ALT 32 07/22/2019 1037   ALKPHOS 100 07/22/2019 1037   BILITOT 0.3 07/22/2019 1037      Component Value Date/Time   TSH 1.220 07/22/2019 1037   Results for LEYANI, VALVO (MRN [id number]) as of 08/06/2019 17:58  Ref. Range 07/22/2019 10:37  Vitamin D, 25-Hydroxy Latest Ref Range: 30.0 - 100.0 ng/mL 41.3   OBESITY BEHAVIORAL INTERVENTION VISIT  Today's visit was #2   Starting weight: 179 lbs Starting date: 07/22/2019 Today's weight: 175 lbs Today's date: 08/05/2019 Total lbs lost to date: 4 At least 15 minutes were spent on discussing the following behavioral intervention visit.    08/05/2019  Height 5\' 4"  (1.626 m)  Weight 175 lb (79.4 kg)  BMI (Calculated) 30.02  BLOOD PRESSURE - SYSTOLIC [id number]  BLOOD PRESSURE -  DIASTOLIC 66   Body Fat % 0000000 %  Total Body Water (lbs) 66 lbs   ASK: We discussed the diagnosis of obesity with Rickey Primus today and Emmalynn agreed to give [place] permission to discuss obesity behavioral modification  therapy today.  ASSESS: Asalee has the diagnosis of obesity and her BMI today is 30.1. Tailyn is in the action stage of change.   ADVISE: Philisha was educated on the multiple health risks of obesity as well as the benefit of weight loss to improve her health. She was advised of the need for long term treatment and the importance of lifestyle modifications to improve her current health and to decrease her risk of future health problems.  AGREE: Multiple dietary modification options and treatment options were discussed and  Patches agreed to follow the recommendations documented in the above note.  ARRANGE: Seema was educated on the importance of frequent visits to treat obesity as outlined per CMS and USPSTF guidelines and agreed to schedule her next follow up appointment today.  Migdalia Dk, am acting as Location manager for [employer], DO  I have reviewed the above documentation for accuracy and completeness, and I agree with the above. -Jearld Lesch, DO

## 2019-08-07 ENCOUNTER — Encounter (INDEPENDENT_AMBULATORY_CARE_PROVIDER_SITE_OTHER): Payer: Self-pay | Admitting: Bariatrics

## 2019-08-28 ENCOUNTER — Encounter (INDEPENDENT_AMBULATORY_CARE_PROVIDER_SITE_OTHER): Payer: Self-pay

## 2019-08-29 ENCOUNTER — Ambulatory Visit (INDEPENDENT_AMBULATORY_CARE_PROVIDER_SITE_OTHER): Payer: Medicare Other | Admitting: Bariatrics

## 2019-08-29 ENCOUNTER — Encounter

## 2019-09-10 ENCOUNTER — Encounter (INDEPENDENT_AMBULATORY_CARE_PROVIDER_SITE_OTHER): Payer: Self-pay | Admitting: Bariatrics

## 2019-09-10 ENCOUNTER — Ambulatory Visit (INDEPENDENT_AMBULATORY_CARE_PROVIDER_SITE_OTHER): Payer: Medicare Other | Admitting: Bariatrics

## 2019-09-10 ENCOUNTER — Other Ambulatory Visit: Payer: Self-pay

## 2019-09-10 VITALS — BP 103/66 | HR 77 | Temp 97.6°F | Ht 64.0 in | Wt 172.0 lb

## 2019-09-10 DIAGNOSIS — E559 Vitamin D deficiency, unspecified: Secondary | ICD-10-CM | POA: Diagnosis not present

## 2019-09-10 DIAGNOSIS — I10 Essential (primary) hypertension: Secondary | ICD-10-CM

## 2019-09-10 DIAGNOSIS — Z6830 Body mass index (BMI) 30.0-30.9, adult: Secondary | ICD-10-CM | POA: Diagnosis not present

## 2019-09-10 DIAGNOSIS — E669 Obesity, unspecified: Secondary | ICD-10-CM | POA: Diagnosis not present

## 2019-09-10 DIAGNOSIS — R7303 Prediabetes: Secondary | ICD-10-CM | POA: Diagnosis not present

## 2019-09-10 MED ORDER — VITAMIN D (ERGOCALCIFEROL) 1.25 MG (50000 UNIT) PO CAPS: 50000.0000 [IU] | ORAL_CAPSULE | ORAL | 0 refills | Status: DC

## 2019-09-16 ENCOUNTER — Encounter (INDEPENDENT_AMBULATORY_CARE_PROVIDER_SITE_OTHER): Payer: Self-pay | Admitting: Bariatrics

## 2019-09-16 NOTE — Progress Notes (Signed)
Office: [phone]  /  Fax: [phone]   HPI:   Chief Complaint: OBESITY [name] is here to discuss her progress with her obesity treatment plan. She is on the Category 1 plan and is following her eating plan approximately 80% of the time. She states she is walking 30 minutes 5 times per week. [name] is down 3 lbs and doing well overall. She does crave sweets at times. Her weight is 172 lb (78 kg) today and has had a weight loss of 3 pounds over a period of 5 weeks since her last visit. She has lost 7 lbs since starting treatment with [place].  Pre-Diabetes [name] has a diagnosis of prediabetes based on her elevated Hgb A1c and was informed this puts her at greater risk of developing diabetes. Last A1c 5.9 on 07/22/2019 with an insulin of 16.2. She is not taking metformin currently and continues to work on diet and exercise to decrease risk of diabetes. She denies nausea or hypoglycemia.  Hypertension [name] is a 68 y.o. female with hypertension and is taking Norvasc and Dyazide. [name] denies chest pain or shortness of breath on exertion. She is working weight loss to help control her blood pressure with the goal of decreasing her risk of heart attack and stroke. [name]'s blood pressure is well controlled.  Vitamin D deficiency [name] has a diagnosis of Vitamin D deficiency. She is currently taking prescription Vit D and denies nausea, vomiting or muscle weakness.  ASSESSMENT AND PLAN:  Prediabetes  Essential hypertension  Vitamin D deficiency - Plan: Vitamin D, Ergocalciferol, (DRISDOL) 1.25 MG (50000 UT) CAPS capsule  Class 1 obesity with serious comorbidity and body mass index (BMI) of 30.0 to 30.9 in adult, unspecified obesity type - BMI greater than 30 at start of program  PLAN:  Pre-Diabetes [name] will continue to work on weight loss, exercise, and decreasing simple carbohydrates in her diet to help decrease the risk of diabetes. We dicussed metformin including benefits  and risks. She was informed that eating too many simple carbohydrates or too many calories at one sitting increases the likelihood of GI side effects. [name] will decrease carbohydrates, increase protein and healthy fats.  Hypertension We discussed sodium restriction, working on healthy weight loss, and a regular exercise program as the means to achieve improved blood pressure control. [name] agreed with this plan and agreed to follow up as directed. We will continue to monitor her blood pressure as well as her progress with the above lifestyle modifications. She will continue her medications as prescribed and will watch for signs of hypotension as she continues her lifestyle modifications.  Vitamin D Deficiency [name] was informed that low Vitamin D levels contributes to fatigue and are associated with obesity, breast, and colon cancer. She agrees to continue to take prescription Vit D @ 50,000 IU every week #4 with 0 refills and will follow-up for routine testing of Vitamin D, at least 2-3 times per year. She was informed of the risk of over-replacement of Vitamin D and agrees to not increase her dose unless she discusses this with [place] first. [name] agrees to follow-up with our clinic in 6 weeks.  Obesity [name] is currently in the action stage of change. As such, her goal is to continue with weight loss efforts. She has agreed to follow the Category 1 plan with additional lunch options. [name] was given alternatives for sweets and she will increase her water intake to 64 ounces daily. We discussed alternatives to eating out. [name]  has been instructed to continue walking and increase activity as needed for weight loss and overall health benefits. We discussed the following Behavioral Modification Strategies today: increasing lean protein intake, decreasing simple carbohydrates, increasing vegetables, increase H20 intake, decrease eating out, no skipping meals, work on meal planning and easy cooking plans, keeping  healthy foods in the home, travel eating strategies, holiday eating strategies, and celebration eating strategies.  [name] has agreed to follow-up with our clinic in 6 weeks. She was informed of the importance of frequent follow-up visits to maximize her success with intensive lifestyle modifications for her multiple health conditions.  ALLERGIES: No Known Allergies  MEDICATIONS: Current Outpatient Medications on File Prior to Visit  Medication Sig Dispense Refill  . amLODipine (NORVASC) 5 MG tablet Take 5 mg by mouth daily.    Marland Kitchen atorvastatin (LIPITOR) 20 MG tablet Take 20 mg by mouth daily.    . Calcium Carbonate-Vit D-Min (CALCIUM 1200 PO) Take by mouth.    . Cholecalciferol (VITAMIN D3) 50 MCG (2000 UT) capsule Take 2,000 Units by mouth daily.    . Coenzyme Q10 (CO Q-10) 100 MG CAPS Take by mouth.    . diclofenac (VOLTAREN) 75 MG EC tablet Take 75 mg by mouth 2 (two) times daily.    Marland Kitchen loratadine (CLARITIN) 10 MG tablet Take 10 mg by mouth daily.    . magnesium oxide (HM MAGNESIUM) 400 MG tablet Take 400 mg by mouth daily. 400 - 800 mg    . traZODone (DESYREL) 50 MG tablet Take 50 mg by mouth at bedtime.    . triamterene-hydrochlorothiazide (DYAZIDE) 37.5-25 MG per capsule Take 1 capsule by mouth every morning.     No current facility-administered medications on file prior to visit.     PAST MEDICAL HISTORY: Past Medical History:  Diagnosis Date  . Complication of breast implant 06/2013  . Constipation   . Food allergy   . GERD (gastroesophageal reflux disease)   . GERD (gastroesophageal reflux disease)   . High cholesterol   . Hypertension    under control with med., has been on med. x 5 yr.  . Joint pain   . Osteoarthritis of both knees   . PONV (postoperative nausea and vomiting)   . Seasonal allergies   . Sensitive skin     PAST SURGICAL HISTORY: Past Surgical History:  Procedure Laterality Date  . ABDOMINAL HYSTERECTOMY     partial - age 68  . APPENDECTOMY    .  BREAST BIOPSY     x 2  . BREAST CAPSULECTOMY WITH IMPLANT EXCHANGE Right 09/17/2007   with exc. accessory breast tissue right breast and axilla  . BREAST CAPSULECTOMY WITH IMPLANT EXCHANGE Bilateral 07/22/2013   Procedure: BILATERAL BREAST CAPSULECTOMY WITH IMPLANT EXCHANGE AND RIGHT RECONSTRUCTION/REVISION;  Surgeon: Cristine Polio, MD;  Location: [facility];  Service: Plastics;  Laterality: Bilateral;  . KNEE ARTHROSCOPY Right 09/12/2001  . MASTECTOMY Bilateral   . PLACEMENT OF BREAST IMPLANTS     after mastectomy  . REMOVAL OF BILATERAL TISSUE EXPANDERS WITH PLACEMENT OF BILATERAL BREAST IMPLANTS    . TONSILLECTOMY     age 67  . TOTAL KNEE ARTHROPLASTY Right 10/25/2002    SOCIAL HISTORY: Social History   Tobacco Use  . Smoking status: Never Smoker  . Smokeless tobacco: Never Used  Substance Use Topics  . Alcohol use: No  . Drug use: No    FAMILY HISTORY: Family History  Problem Relation Age of Onset  . Diabetes Mother   .  High blood pressure Mother   . Anxiety disorder Mother   . Heart disease Father    ROS: Review of Systems  Respiratory: Negative for shortness of breath.   Cardiovascular: Negative for chest pain.  Gastrointestinal: Negative for nausea and vomiting.  Musculoskeletal:       Negative for muscle weakness.  Endo/Heme/Allergies:       Negative for hypoglycemia.   PHYSICAL EXAM: Blood pressure 103/66, pulse 77, temperature 97.6 F (36.4 C), height 5\' 4"  (1.626 m), weight 172 lb (78 kg), SpO2 98 %. Body mass index is 29.52 kg/m. Physical Exam Vitals signs reviewed.  Constitutional:      Appearance: Normal appearance. She is obese.  Cardiovascular:     Rate and Rhythm: Normal rate.     Pulses: Normal pulses.  Pulmonary:     Effort: Pulmonary effort is normal.     Breath sounds: Normal breath sounds.  Musculoskeletal: Normal range of motion.  Skin:    General: Skin is warm and dry.  Neurological:     Mental Status: She is alert  and oriented to person, place, and time.  Psychiatric:        Behavior: Behavior normal.   RECENT LABS AND TESTS: BMET    Component Value Date/Time   NA 139 07/22/2019 1037   K 4.1 07/22/2019 1037   CL 102 07/22/2019 1037   CO2 21 07/22/2019 1037   GLUCOSE 116 (H) 07/22/2019 1037   BUN 11 07/22/2019 1037   CREATININE 0.80 07/22/2019 1037   CALCIUM 9.4 07/22/2019 1037   GFRNONAA 75 07/22/2019 1037   GFRAA 87 07/22/2019 1037   Lab Results  Component Value Date   HGBA1C 5.9 (H) 07/22/2019   Lab Results  Component Value Date   INSULIN 16.2 07/22/2019   CBC    Component Value Date/Time   HGB 13.8 07/22/2013 0718   Iron/TIBC/Ferritin/ %Sat No results found for: IRON, TIBC, FERRITIN, IRONPCTSAT Lipid Panel     Component Value Date/Time   CHOL 261 (H) 07/22/2019 1037   TRIG 157 (H) 07/22/2019 1037   HDL 62 07/22/2019 1037   LDLCALC 171 (H) 07/22/2019 1037   Hepatic Function Panel     Component Value Date/Time   PROT 7.4 07/22/2019 1037   ALBUMIN 4.7 07/22/2019 1037   AST 30 07/22/2019 1037   ALT 32 07/22/2019 1037   ALKPHOS 100 07/22/2019 1037   BILITOT 0.3 07/22/2019 1037      Component Value Date/Time   TSH 1.220 07/22/2019 1037   Results for KLARE, DOKES (MRN [id number]) as of 09/16/2019 07:18  Ref. Range 07/22/2019 10:37  Vitamin D, 25-Hydroxy Latest Ref Range: 30.0 - 100.0 ng/mL 41.3   OBESITY BEHAVIORAL INTERVENTION VISIT  Today's visit was #3  Starting weight: 179 lbs Starting date: 07/22/2019 Today's weight: 172 lbs  Today's date: 09/10/2019 Total lbs lost to date: 7  At least 15 minutes were spent on discussing the following behavioral intervention visit.    09/10/2019  Height 5\' 4"  (1.626 m)  Weight 172 lb (78 kg)  BMI (Calculated) 29.51  BLOOD PRESSURE - SYSTOLIC [id number]  BLOOD PRESSURE - DIASTOLIC 66   Body Fat % 99991111 %  Total Body Water (lbs) 68.2 lbs   ASK: We discussed the diagnosis of obesity with Rickey Primus today and Zamyria agreed  to give [place] permission to discuss obesity behavioral modification therapy today.  ASSESS: Desrae has the diagnosis of obesity and her BMI today is 29.8. Leeanna is in the action stage  of change.   ADVISE: Jemiyah was educated on the multiple health risks of obesity as well as the benefit of weight loss to improve her health. She was advised of the need for long term treatment and the importance of lifestyle modifications to improve her current health and to decrease her risk of future health problems.  AGREE: Multiple dietary modification options and treatment options were discussed and  Lynne agreed to follow the recommendations documented in the above note.  ARRANGE: Joyous was educated on the importance of frequent visits to treat obesity as outlined per CMS and USPSTF guidelines and agreed to schedule her next follow up appointment today.  Migdalia Dk, am acting as Location manager for [employer], DO   I have reviewed the above documentation for accuracy and completeness, and I agree with the above. -Jearld Lesch, DO

## 2019-10-30 ENCOUNTER — Ambulatory Visit (INDEPENDENT_AMBULATORY_CARE_PROVIDER_SITE_OTHER): Payer: Medicare Other | Admitting: Bariatrics

## 2019-11-13 ENCOUNTER — Ambulatory Visit (INDEPENDENT_AMBULATORY_CARE_PROVIDER_SITE_OTHER): Payer: Medicare Other | Admitting: Family Medicine

## 2020-12-30 DIAGNOSIS — H35372 Puckering of macula, left eye: Secondary | ICD-10-CM | POA: Diagnosis not present

## 2020-12-30 DIAGNOSIS — H348312 Tributary (branch) retinal vein occlusion, right eye, stable: Secondary | ICD-10-CM | POA: Diagnosis not present

## 2020-12-30 DIAGNOSIS — H34812 Central retinal vein occlusion, left eye, with macular edema: Secondary | ICD-10-CM | POA: Diagnosis not present

## 2020-12-30 DIAGNOSIS — H3582 Retinal ischemia: Secondary | ICD-10-CM | POA: Diagnosis not present

## 2021-03-31 DIAGNOSIS — H35372 Puckering of macula, left eye: Secondary | ICD-10-CM | POA: Diagnosis not present

## 2021-03-31 DIAGNOSIS — H34812 Central retinal vein occlusion, left eye, with macular edema: Secondary | ICD-10-CM | POA: Diagnosis not present

## 2021-03-31 DIAGNOSIS — H348312 Tributary (branch) retinal vein occlusion, right eye, stable: Secondary | ICD-10-CM | POA: Diagnosis not present

## 2021-03-31 DIAGNOSIS — H3582 Retinal ischemia: Secondary | ICD-10-CM | POA: Diagnosis not present

## 2021-06-30 DIAGNOSIS — H34812 Central retinal vein occlusion, left eye, with macular edema: Secondary | ICD-10-CM | POA: Diagnosis not present

## 2021-06-30 DIAGNOSIS — H35372 Puckering of macula, left eye: Secondary | ICD-10-CM | POA: Diagnosis not present

## 2021-06-30 DIAGNOSIS — H348312 Tributary (branch) retinal vein occlusion, right eye, stable: Secondary | ICD-10-CM | POA: Diagnosis not present

## 2021-06-30 DIAGNOSIS — H3582 Retinal ischemia: Secondary | ICD-10-CM | POA: Diagnosis not present

## 2021-07-19 DIAGNOSIS — N6001 Solitary cyst of right breast: Secondary | ICD-10-CM | POA: Diagnosis not present

## 2021-07-19 DIAGNOSIS — Z23 Encounter for immunization: Secondary | ICD-10-CM | POA: Diagnosis not present

## 2021-07-19 DIAGNOSIS — I10 Essential (primary) hypertension: Secondary | ICD-10-CM | POA: Diagnosis not present

## 2021-07-19 DIAGNOSIS — R110 Nausea: Secondary | ICD-10-CM | POA: Diagnosis not present

## 2021-07-19 DIAGNOSIS — Z0000 Encounter for general adult medical examination without abnormal findings: Secondary | ICD-10-CM | POA: Diagnosis not present

## 2021-07-19 DIAGNOSIS — E785 Hyperlipidemia, unspecified: Secondary | ICD-10-CM | POA: Diagnosis not present

## 2021-07-23 ENCOUNTER — Other Ambulatory Visit: Payer: Self-pay | Admitting: Nurse Practitioner

## 2021-07-23 DIAGNOSIS — N6001 Solitary cyst of right breast: Secondary | ICD-10-CM

## 2021-08-11 ENCOUNTER — Other Ambulatory Visit: Payer: Self-pay

## 2021-08-11 ENCOUNTER — Ambulatory Visit
Admission: RE | Admit: 2021-08-11 | Discharge: 2021-08-11 | Disposition: A | Discharge status: Home / Self Care | Payer: Medicare Other | Source: Ambulatory Visit | Attending: Nurse Practitioner | Admitting: Nurse Practitioner

## 2021-08-11 ENCOUNTER — Other Ambulatory Visit: Payer: Self-pay | Admitting: Nurse Practitioner

## 2021-08-11 DIAGNOSIS — N6001 Solitary cyst of right breast: Secondary | ICD-10-CM

## 2021-08-11 DIAGNOSIS — R922 Inconclusive mammogram: Secondary | ICD-10-CM | POA: Diagnosis not present

## 2021-08-11 DIAGNOSIS — N6310 Unspecified lump in the right breast, unspecified quadrant: Secondary | ICD-10-CM

## 2021-08-17 ENCOUNTER — Ambulatory Visit
Admission: RE | Admit: 2021-08-17 | Discharge: 2021-08-17 | Disposition: A | Discharge status: Home / Self Care | Payer: Medicare Other | Source: Ambulatory Visit | Attending: Nurse Practitioner | Admitting: Nurse Practitioner

## 2021-08-17 DIAGNOSIS — N6341 Unspecified lump in right breast, subareolar: Secondary | ICD-10-CM | POA: Diagnosis not present

## 2021-08-17 DIAGNOSIS — R590 Localized enlarged lymph nodes: Secondary | ICD-10-CM | POA: Diagnosis not present

## 2021-08-17 DIAGNOSIS — N6310 Unspecified lump in the right breast, unspecified quadrant: Secondary | ICD-10-CM

## 2021-09-01 ENCOUNTER — Other Ambulatory Visit: Payer: Self-pay | Admitting: Surgery

## 2021-09-01 DIAGNOSIS — R591 Generalized enlarged lymph nodes: Secondary | ICD-10-CM | POA: Diagnosis not present

## 2021-09-16 ENCOUNTER — Encounter (HOSPITAL_BASED_OUTPATIENT_CLINIC_OR_DEPARTMENT_OTHER): Payer: Self-pay | Admitting: Surgery

## 2021-09-16 ENCOUNTER — Other Ambulatory Visit: Payer: Self-pay

## 2021-09-21 ENCOUNTER — Encounter (HOSPITAL_BASED_OUTPATIENT_CLINIC_OR_DEPARTMENT_OTHER)
Admission: RE | Admit: 2021-09-21 | Discharge: 2021-09-21 | Disposition: A | Discharge status: Home / Self Care | Payer: Medicare Other | Source: Ambulatory Visit | Attending: Surgery | Admitting: Surgery

## 2021-09-21 DIAGNOSIS — Z01812 Encounter for preprocedural laboratory examination: Secondary | ICD-10-CM | POA: Insufficient documentation

## 2021-09-21 LAB — BASIC METABOLIC PANEL
Anion gap: 15 (ref 5–15)
BUN: 12 mg/dL (ref 8–23)
CO2: 17 mmol/L — ABNORMAL LOW (ref 22–32)
Calcium: 9 mg/dL (ref 8.9–10.3)
Chloride: 105 mmol/L (ref 98–111)
Creatinine, Ser: 0.83 mg/dL (ref 0.44–1.00)
GFR, Estimated: >60 mL/min (ref >60)
Glucose, Bld: 91 mg/dL (ref 70–99)
Potassium: 4.5 mmol/L (ref 3.5–5.1)
Sodium: 137 mmol/L (ref 135–145)

## 2021-09-21 NOTE — Progress Notes (Signed)

## 2021-09-28 NOTE — H&P (Signed)
REFERRING PHYSICIAN: Severiano Gilbert, *  PROVIDER: Beverlee Nims, MD  MRN: [id number] DOB: Jul 24, 1951 DATE OF ENCOUNTER: 09/01/2021 Subjective  Chief Complaint: New Consultation (Right Breast )   History of Present Illness: [name] is a 70 y.o. female who is seen today as an office consultation at the request of Dr. Quillian Quince for evaluation of New Consultation (Right Breast ) .   This is a pleasant 70 year old female here for evaluation of a right breast mass. She recently noticed a small mass on her breast. She underwent an ultrasound showing a 1.7 cm mass. She underwent a biopsy of this showing it to be a lymph node with hyperplasia and some other abnormalities. Lymphoma cannot be completely ruled out. She is otherwise healthy. About 30 years ago she had bilateral mastectomies because she was determined be high risk for breast cancer in future. She has had various surgeries on her implants in the past. She denies nipple discharge. She has no systemic symptoms of fever or chills. She tolerated the biopsy well. She has no issues with general anesthesia.  Review of Systems: A complete review of systems was obtained from the patient. I have reviewed this information and discussed as appropriate with the patient. See HPI as well for other ROS.  ROS   Medical History: Past Medical History:  Diagnosis Date   GERD (gastroesophageal reflux disease)   Hypertension   Patient Active Problem List  Diagnosis   Allergic rhinitis   Body mass index (BMI) 32.0-32.9, adult   Chronic pain   Essential hypertension   Gastro-esophageal reflux disease without esophagitis   Hyperlipidemia   Insomnia   Iron deficiency anemia   Menopause   Constipation   Nausea   Prediabetes   Past Surgical History:  Procedure Laterality Date   Breast Implants Bilateral 07/2013   HYSTERECTOMY 1978   JOINT REPLACEMENT Right 2004    Not on File  Current Outpatient Medications on File Prior  to Visit  Medication Sig Dispense Refill   amLODIPine (NORVASC) 5 MG tablet Take 1 tablet (5 mg total) by mouth once daily   atorvastatin (LIPITOR) 20 MG tablet 1 tablet (20 mg total)   cholecalciferol (VITAMIN D3) 2,000 unit capsule Take by mouth   triamterene-hydrochlorothiazide (MAXZIDE-25) 37.5-25 mg tablet Take 1 tablet by mouth every morning   No current facility-administered medications on file prior to visit.   History reviewed. No pertinent family history.   Social History   Tobacco Use  Smoking Status Never  Smokeless Tobacco Never    Social History   Socioeconomic History   Marital status: Married  Tobacco Use   Smoking status: Never   Smokeless tobacco: Never  Vaping Use   Vaping Use: Never used  Substance and Sexual Activity   Alcohol use: Never   Drug use: Never   Objective:   Vitals:  09/01/21 1357  BP: (!) 162/88  Pulse: 89  Temp: 36.7 C (98 F)  SpO2: 98%   There is no height or weight on file to calculate BMI.  Physical Exam   She appears well on exam.  She has bilateral breast implants.  On the right breast at the 12 o'clock position approximately 8 cm from nipple is a 1.5 to 2 cm superficial mass. There are no other abnormalities. There is no axillary adenopathy.  Lungs clear CV RRR Abdomen soft, NT Neuro grossly intact  Labs, Imaging and Diagnostic Testing: I have reviewed her mammogram, ultrasound, and pathology results.  Assessment and Plan:  Diagnoses and all orders for this visit:  Lymphadenopathy    This is a patient with a right breast mass which is an atypical lymph node on recent core biopsy. I discussed this with her in detail. I gave her a copy of the pathology results. Pathology and radiology have recommended complete removal of the mass/lymph node so it can be evaluated histologically to rule out a lymphoma. I discussed the reasons for this with her in detail. We discussed proceeding with a right breast lumpectomy. I  explained the surgical procedure in detail. We discussed the risk which includes but is not limited to bleeding, infection, injury to her underlying breast implant, the need for further procedures, cardiopulmonary issues, postoperative recovery, etc. She understands and wishes to proceed with surgery which will be scheduled.

## 2021-09-29 ENCOUNTER — Ambulatory Visit (HOSPITAL_BASED_OUTPATIENT_CLINIC_OR_DEPARTMENT_OTHER)
Admission: RE | Admit: 2021-09-29 | Discharge: 2021-09-29 | Disposition: A | Discharge status: Home / Self Care | Payer: Medicare Other | Attending: Surgery | Admitting: Surgery

## 2021-09-29 ENCOUNTER — Encounter (HOSPITAL_BASED_OUTPATIENT_CLINIC_OR_DEPARTMENT_OTHER): Payer: Self-pay | Admitting: Surgery

## 2021-09-29 ENCOUNTER — Encounter (HOSPITAL_BASED_OUTPATIENT_CLINIC_OR_DEPARTMENT_OTHER)
Admission: RE | Disposition: A | Discharge status: Home / Self Care | Payer: Self-pay | Source: Home / Self Care | Attending: Surgery

## 2021-09-29 ENCOUNTER — Other Ambulatory Visit: Payer: Self-pay

## 2021-09-29 ENCOUNTER — Ambulatory Visit (HOSPITAL_BASED_OUTPATIENT_CLINIC_OR_DEPARTMENT_OTHER): Payer: Medicare Other | Admitting: Certified Registered"

## 2021-09-29 DIAGNOSIS — M1990 Unspecified osteoarthritis, unspecified site: Secondary | ICD-10-CM | POA: Insufficient documentation

## 2021-09-29 DIAGNOSIS — I10 Essential (primary) hypertension: Secondary | ICD-10-CM | POA: Diagnosis not present

## 2021-09-29 DIAGNOSIS — E669 Obesity, unspecified: Secondary | ICD-10-CM | POA: Insufficient documentation

## 2021-09-29 DIAGNOSIS — K219 Gastro-esophageal reflux disease without esophagitis: Secondary | ICD-10-CM | POA: Diagnosis not present

## 2021-09-29 DIAGNOSIS — N6315 Unspecified lump in the right breast, overlapping quadrants: Secondary | ICD-10-CM | POA: Diagnosis not present

## 2021-09-29 DIAGNOSIS — R590 Localized enlarged lymph nodes: Secondary | ICD-10-CM | POA: Diagnosis not present

## 2021-09-29 DIAGNOSIS — Z9013 Acquired absence of bilateral breasts and nipples: Secondary | ICD-10-CM | POA: Diagnosis not present

## 2021-09-29 DIAGNOSIS — N6310 Unspecified lump in the right breast, unspecified quadrant: Secondary | ICD-10-CM | POA: Diagnosis not present

## 2021-09-29 DIAGNOSIS — Z9882 Breast implant status: Secondary | ICD-10-CM | POA: Diagnosis not present

## 2021-09-29 DIAGNOSIS — Z6830 Body mass index (BMI) 30.0-30.9, adult: Secondary | ICD-10-CM | POA: Diagnosis not present

## 2021-09-29 HISTORY — PX: BREAST LUMPECTOMY: SHX2

## 2021-09-29 SURGERY — BREAST LUMPECTOMY
Anesthesia: General | Site: Breast | Laterality: Right

## 2021-09-29 MED ORDER — DROPERIDOL 2.5 MG/ML IJ SOLN
INTRAMUSCULAR | Status: DC | PRN
  Administered 2021-09-29: .625 mg via INTRAVENOUS

## 2021-09-29 MED ORDER — ACETAMINOPHEN 500 MG PO TABS
ORAL_TABLET | ORAL | Status: AC
  Filled 2021-09-29: qty 1

## 2021-09-29 MED ORDER — ONDANSETRON HCL 4 MG/2ML IJ SOLN
INTRAMUSCULAR | Status: AC
  Filled 2021-09-29: qty 2

## 2021-09-29 MED ORDER — DEXAMETHASONE SODIUM PHOSPHATE 4 MG/ML IJ SOLN
INTRAMUSCULAR | Status: DC | PRN
  Administered 2021-09-29: 10 mg via INTRAVENOUS

## 2021-09-29 MED ORDER — ALBUTEROL SULFATE HFA 108 (90 BASE) MCG/ACT IN AERS
INHALATION_SPRAY | RESPIRATORY_TRACT | Status: AC
  Filled 2021-09-29: qty 6.7

## 2021-09-29 MED ORDER — LIDOCAINE 2% (20 MG/ML) 5 ML SYRINGE
INTRAMUSCULAR | Status: AC
  Filled 2021-09-29: qty 5

## 2021-09-29 MED ORDER — HYDROMORPHONE HCL 1 MG/ML IJ SOLN: 0.2500 mg | INTRAMUSCULAR | Status: DC | PRN

## 2021-09-29 MED ORDER — BUPIVACAINE-EPINEPHRINE 0.5% -1:200000 IJ SOLN
INTRAMUSCULAR | Status: DC | PRN
  Administered 2021-09-29: 3 mL

## 2021-09-29 MED ORDER — DEXAMETHASONE SODIUM PHOSPHATE 10 MG/ML IJ SOLN
INTRAMUSCULAR | Status: AC
  Filled 2021-09-29: qty 1

## 2021-09-29 MED ORDER — FENTANYL CITRATE (PF) 100 MCG/2ML IJ SOLN
INTRAMUSCULAR | Status: AC
  Filled 2021-09-29: qty 2

## 2021-09-29 MED ORDER — FENTANYL CITRATE (PF) 100 MCG/2ML IJ SOLN
INTRAMUSCULAR | Status: DC | PRN
  Administered 2021-09-29: 50 ug via INTRAVENOUS

## 2021-09-29 MED ORDER — PROPOFOL 10 MG/ML IV BOLUS
INTRAVENOUS | Status: AC
  Filled 2021-09-29: qty 20

## 2021-09-29 MED ORDER — OXYCODONE HCL 5 MG/5ML PO SOLN: 5.0000 mg | Freq: Once | ORAL | Status: DC | PRN

## 2021-09-29 MED ORDER — CHLORHEXIDINE GLUCONATE CLOTH 2 % EX PADS: 6.0000 | MEDICATED_PAD | Freq: Once | CUTANEOUS | Status: DC

## 2021-09-29 MED ORDER — ONDANSETRON HCL 4 MG/2ML IJ SOLN
INTRAMUSCULAR | Status: DC | PRN
  Administered 2021-09-29: 4 mg via INTRAVENOUS

## 2021-09-29 MED ORDER — LACTATED RINGERS IV SOLN: INTRAVENOUS | Status: DC

## 2021-09-29 MED ORDER — PHENYLEPHRINE 40 MCG/ML (10ML) SYRINGE FOR IV PUSH (FOR BLOOD PRESSURE SUPPORT)
PREFILLED_SYRINGE | INTRAVENOUS | Status: AC
  Filled 2021-09-29: qty 10

## 2021-09-29 MED ORDER — DEXMEDETOMIDINE (PRECEDEX) IN NS 20 MCG/5ML (4 MCG/ML) IV SYRINGE
PREFILLED_SYRINGE | INTRAVENOUS | Status: AC
  Filled 2021-09-29: qty 5

## 2021-09-29 MED ORDER — EPHEDRINE 5 MG/ML INJ
INTRAVENOUS | Status: AC
  Filled 2021-09-29: qty 5

## 2021-09-29 MED ORDER — ACETAMINOPHEN 500 MG PO TABS
1000.0000 mg | ORAL_TABLET | ORAL | Status: AC
  Administered 2021-09-29: 11:00:00 1000 mg via ORAL

## 2021-09-29 MED ORDER — PHENYLEPHRINE HCL (PRESSORS) 10 MG/ML IV SOLN
INTRAVENOUS | Status: DC | PRN
  Administered 2021-09-29: 120 ug via INTRAVENOUS
  Administered 2021-09-29: 80 ug via INTRAVENOUS

## 2021-09-29 MED ORDER — DROPERIDOL 2.5 MG/ML IJ SOLN
INTRAMUSCULAR | Status: AC
  Filled 2021-09-29: qty 2

## 2021-09-29 MED ORDER — LIDOCAINE 2% (20 MG/ML) 5 ML SYRINGE
INTRAMUSCULAR | Status: DC | PRN
  Administered 2021-09-29: 60 mg via INTRAVENOUS

## 2021-09-29 MED ORDER — CEFAZOLIN SODIUM-DEXTROSE 2-4 GM/100ML-% IV SOLN
INTRAVENOUS | Status: AC
  Filled 2021-09-29: qty 100

## 2021-09-29 MED ORDER — OXYCODONE HCL 5 MG PO TABS: 5.0000 mg | ORAL_TABLET | Freq: Once | ORAL | Status: DC | PRN

## 2021-09-29 MED ORDER — TRAMADOL HCL 50 MG PO TABS: 50.0000 mg | ORAL_TABLET | Freq: Four times a day (QID) | ORAL | 0 refills | Status: DC | PRN

## 2021-09-29 MED ORDER — EPHEDRINE SULFATE 50 MG/ML IJ SOLN
INTRAMUSCULAR | Status: DC | PRN
  Administered 2021-09-29 (×2): 10 mg via INTRAVENOUS

## 2021-09-29 MED ORDER — CEFAZOLIN SODIUM-DEXTROSE 2-4 GM/100ML-% IV SOLN
2.0000 g | INTRAVENOUS | Status: AC
  Administered 2021-09-29: 11:00:00 2 g via INTRAVENOUS

## 2021-09-29 MED ORDER — PROPOFOL 10 MG/ML IV BOLUS
INTRAVENOUS | Status: DC | PRN
  Administered 2021-09-29: 200 mg via INTRAVENOUS

## 2021-09-29 MED ORDER — PROMETHAZINE HCL 25 MG/ML IJ SOLN: 6.2500 mg | INTRAMUSCULAR | Status: DC | PRN

## 2021-09-29 SURGICAL SUPPLY — 37 items
ADH SKN CLS APL DERMABOND .7 (GAUZE/BANDAGES/DRESSINGS) ×1
APL PRP STRL LF DISP 70% ISPRP (MISCELLANEOUS) ×1
BLADE SURG 15 STRL LF DISP TIS (BLADE) ×1 IMPLANT
BLADE SURG 15 STRL SS (BLADE) ×2
CANISTER SUCT 1200ML W/VALVE (MISCELLANEOUS) IMPLANT
CHLORAPREP W/TINT 26 (MISCELLANEOUS) ×2 IMPLANT
COVER BACK TABLE 60X90IN (DRAPES) ×2 IMPLANT
COVER MAYO STAND STRL (DRAPES) ×2 IMPLANT
DECANTER SPIKE VIAL GLASS SM (MISCELLANEOUS) IMPLANT
DERMABOND ADVANCED (GAUZE/BANDAGES/DRESSINGS) ×1
DERMABOND ADVANCED .7 DNX12 (GAUZE/BANDAGES/DRESSINGS) ×1 IMPLANT
DRAPE LAPAROTOMY 100X72 PEDS (DRAPES) ×2 IMPLANT
DRAPE UTILITY XL STRL (DRAPES) ×2 IMPLANT
ELECT REM PT RETURN 9FT ADLT (ELECTROSURGICAL) ×2
ELECTRODE REM PT RTRN 9FT ADLT (ELECTROSURGICAL) ×1 IMPLANT
GAUZE SPONGE 4X4 12PLY STRL LF (GAUZE/BANDAGES/DRESSINGS) ×2 IMPLANT
GLOVE SURG SIGNA 7.5 PF LTX (GLOVE) ×2 IMPLANT
GLOVE SURG UNDER POLY LF SZ7 (GLOVE) ×1 IMPLANT
GOWN STRL REUS W/ TWL LRG LVL3 (GOWN DISPOSABLE) ×1 IMPLANT
GOWN STRL REUS W/ TWL XL LVL3 (GOWN DISPOSABLE) ×1 IMPLANT
GOWN STRL REUS W/TWL LRG LVL3 (GOWN DISPOSABLE) ×2
GOWN STRL REUS W/TWL XL LVL3 (GOWN DISPOSABLE) ×2
NDL HYPO 25X1 1.5 SAFETY (NEEDLE) ×1 IMPLANT
NEEDLE HYPO 25X1 1.5 SAFETY (NEEDLE) ×2 IMPLANT
NS IRRIG 1000ML POUR BTL (IV SOLUTION) ×2 IMPLANT
PACK BASIN DAY SURGERY FS (CUSTOM PROCEDURE TRAY) ×2 IMPLANT
PENCIL SMOKE EVACUATOR (MISCELLANEOUS) ×2 IMPLANT
SLEEVE SCD COMPRESS KNEE MED (STOCKING) ×1 IMPLANT
SPONGE T-LAP 4X18 ~~LOC~~+RFID (SPONGE) ×2 IMPLANT
SUT MNCRL AB 4-0 PS2 18 (SUTURE) ×2 IMPLANT
SUT SILK 2 0 SH (SUTURE) ×2 IMPLANT
SUT VIC AB 3-0 SH 27 (SUTURE) ×2
SUT VIC AB 3-0 SH 27X BRD (SUTURE) ×1 IMPLANT
SYR CONTROL 10ML LL (SYRINGE) ×2 IMPLANT
TOWEL GREEN STERILE FF (TOWEL DISPOSABLE) ×2 IMPLANT
TUBE CONNECTING 20X1/4 (TUBING) IMPLANT
YANKAUER SUCT BULB TIP NO VENT (SUCTIONS) IMPLANT

## 2021-09-29 NOTE — Op Note (Signed)
RIGHT BREAST LUMPECTOMY  Procedure Note  Lavaun Greenfield 09/29/2021   Pre-op Diagnosis: RIGHT BREAST MASS     Post-op Diagnosis: Same  Procedure(s): RIGHT BREAST LUMPECTOMY  Surgeon(s): Coralie Keens, MD  Anesthesia: General  Staff:  Circulator: Izora Ribas, RN Scrub Person: Lorenza Burton, CST  Estimated Blood Loss: Minimal               Specimens: Sent to pathology  Indications: This is a 70 year old female who had bilateral mastectomies and reconstruction greater than 30 years ago as she was deemed high risk for breast cancer.  She recently noticed a mass at 12 o'clock position of her right breast.  She underwent an ultrasound on mammogram showing 2 adjacent masses which measured 1.7 cm in total size.  A biopsy of this was consistent with a lymph node however malignancy/lymphoma cannot be ruled out so excision of the mass was recommended  Procedure: The patient is brought to the operating room identifies a correct patient.  She is placed upon the operating table general anesthesia was induced.  Her right breast was prepped and draped in usual sterile fashion.  The mass was located at the 12 o'clock position of the right breast 8 cm from the nipple.  The mass was palpable.  I anesthetized the skin over the mass with Marcaine.  I then made a transverse incision with a scalpel.  I then dissected down to the subcutaneous tissue and identified the mass which appeared consistent with 2 smaller masses in total and excised it in 1 piece with electrocautery.  I can visualize the underlying implant which was left in place and appeared unharmed.  The mass was then sent to pathology for evaluation.  I anesthetized the incision further with Marcaine.  Hemostasis was achieved with the cautery.  I then closed the subcutaneous tissue with interrupted 3-0 Vicryl sutures and closed the skin with running 4-0 Monocryl.  Dermabond was then applied.  The patient tolerated the procedure well.  All the  counts were correct at the end of the procedure.  The patient was then extubated in the operating room and taken in a stable condition to the recovery room.          Coralie Keens   Date: 09/29/2021  Time: 11:33 AM

## 2021-09-29 NOTE — Anesthesia Procedure Notes (Signed)
Procedure Name: LMA Insertion Date/Time: 09/29/2021 11:08 AM Performed by: Ezequiel Kayser, CRNA Pre-anesthesia Checklist: Patient identified, Emergency Drugs available, Suction available and Patient being monitored Patient Re-evaluated:Patient Re-evaluated prior to induction Oxygen Delivery Method: Circle System Utilized Preoxygenation: Pre-oxygenation with 100% oxygen Induction Type: IV induction Ventilation: Mask ventilation without difficulty LMA: LMA inserted LMA Size: 4.0 Number of attempts: 1 Airway Equipment and Method: Bite block Placement Confirmation: positive ETCO2 Tube secured with: Tape Dental Injury: Teeth and Oropharynx as per pre-operative assessment

## 2021-09-29 NOTE — Interval H&P Note (Signed)
History and Physical Interval Note:no change in H and P  09/29/2021 10:43 AM  [name]  has presented today for surgery, with the diagnosis of RIGHT BREAST MASS, LYMPHADEMOPATHY.  The various methods of treatment have been discussed with the patient and family. After consideration of risks, benefits and other options for treatment, the patient has consented to  Procedure(s): RIGHT BREAST LUMPECTOMY (Right) as a surgical intervention.  The patient's history has been reviewed, patient examined, no change in status, stable for surgery.  I have reviewed the patient's chart and labs.  Questions were answered to the patient's satisfaction.     Coralie Keens

## 2021-09-29 NOTE — Discharge Instructions (Addendum)
[facility] Office Phone Number [phone]  BREAST BIOPSY/ PARTIAL MASTECTOMY: POST OP INSTRUCTIONS  Always review your discharge instruction sheet given to you by the facility where your surgery was performed.  IF YOU HAVE DISABILITY OR FAMILY LEAVE FORMS, YOU MUST BRING THEM TO THE OFFICE FOR PROCESSING.  DO NOT GIVE THEM TO YOUR DOCTOR.  A prescription for pain medication may be given to you upon discharge.  Take your pain medication as prescribed, if needed.  If narcotic pain medicine is not needed, then you may take acetaminophen (Tylenol) or ibuprofen (Advil) as needed. Take your usually prescribed medications unless otherwise directed If you need a refill on your pain medication, please contact your pharmacy.  They will contact our office to request authorization.  Prescriptions will not be filled after 5pm or on week-ends. You should eat very light the first 24 hours after surgery, such as soup, crackers, pudding, etc.  Resume your normal diet the day after surgery. Most patients will experience some swelling and bruising in the breast.  Ice packs and a good support bra will help.  Swelling and bruising can take several days to resolve.  It is common to experience some constipation if taking pain medication after surgery.  Increasing fluid intake and taking a stool softener will usually help or prevent this problem from occurring.  A mild laxative (Milk of Magnesia or Miralax) should be taken according to package directions if there are no bowel movements after 48 hours. Unless discharge instructions indicate otherwise, you may remove your bandages 24-48 hours after surgery, and you may shower at that time.  You may have steri-strips (small skin tapes) in place directly over the incision.  These strips should be left on the skin for 7-10 days.  If your surgeon used skin glue on the incision, you may shower in 24 hours.  The glue will flake off over the next 2-3 weeks.  Any  sutures or staples will be removed at the office during your follow-up visit. ACTIVITIES:  You may resume regular daily activities (gradually increasing) beginning the next day.  Wearing a good support bra or sports bra minimizes pain and swelling.  You may have sexual intercourse when it is comfortable. You may drive when you no longer are taking prescription pain medication, you can comfortably wear a seatbelt, and you can safely maneuver your car and apply brakes. RETURN TO WORK:  ______________________________________________________________________________________ [name] should see your doctor in the office for a follow-up appointment approximately two weeks after your surgery.  Your doctors nurse will typically make your follow-up appointment when she calls you with your pathology report.  Expect your pathology report 2-3 business days after your surgery.  You may call to check if you do not hear from [place] after three days. OTHER INSTRUCTIONS: OK TO SHOWER STARTING TOMORROW ICE PACK, TYLENOL, AND IBUPROFEN ALSO FOR PAIN NO VIGOROUS ACTIVITY FOR ONE WEEK Next tylenol after 5pm. _______________________________________________________________________________________________ _____________________________________________________________________________________________________________________________________ _____________________________________________________________________________________________________________________________________ _____________________________________________________________________________________________________________________________________  WHEN TO CALL YOUR DOCTOR: Fever over 101.0 Nausea and/or vomiting. Extreme swelling or bruising. Continued bleeding from incision. Increased pain, redness, or drainage from the incision.  The clinic staff is available to answer your questions during regular business hours.  Please dont hesitate to call and ask to speak to one of  the nurses for clinical concerns.  If you have a medical emergency, go to the nearest emergency room or call 911.  A surgeon from [facility] is always on call at the hospital.  For  further questions, please visit [email or web address]   Post Anesthesia Home Care Instructions  Activity: Get plenty of rest for the remainder of the day. A responsible individual must stay with you for 24 hours following the procedure.  For the next 24 hours, DO NOT: -Drive a car -Paediatric nurse -Drink alcoholic beverages -Take any medication unless instructed by your physician -Make any legal decisions or sign important papers.  Meals: Start with liquid foods such as gelatin or soup. Progress to regular foods as tolerated. Avoid greasy, spicy, heavy foods. If nausea and/or vomiting occur, drink only clear liquids until the nausea and/or vomiting subsides. Call your physician if vomiting continues.  Special Instructions/Symptoms: Your throat may feel dry or sore from the anesthesia or the breathing tube placed in your throat during surgery. If this causes discomfort, gargle with warm salt water. The discomfort should disappear within 24 hours.  If you had a scopolamine patch placed behind your ear for the management of post- operative nausea and/or vomiting:  1. The medication in the patch is effective for 72 hours, after which it should be removed.  Wrap patch in a tissue and discard in the trash. Wash hands thoroughly with soap and water. 2. You may remove the patch earlier than 72 hours if you experience unpleasant side effects which may include dry mouth, dizziness or visual disturbances. 3. Avoid touching the patch. Wash your hands with soap and water after contact with the patch.

## 2021-09-29 NOTE — Anesthesia Preprocedure Evaluation (Signed)
Anesthesia Evaluation  Patient identified by MRN, date of birth, ID band Patient awake    History of Anesthesia Complications (+) PONV and history of anesthetic complications  Airway Mallampati: I  TM Distance: >3 FB Neck ROM: Full    Dental no notable dental hx. (+)    Pulmonary neg pulmonary ROS,    Pulmonary exam normal breath sounds clear to auscultation       Cardiovascular hypertension, Pt. on medications Normal cardiovascular exam Rhythm:Regular Rate:Normal     Neuro/Psych negative neurological ROS     GI/Hepatic GERD  ,  Endo/Other    Renal/GU      Musculoskeletal  (+) Arthritis , Osteoarthritis,    Abdominal (+) + obese,   Peds  Hematology   Anesthesia Other Findings Outer edge of upper left front tooth chipped preop.   Reproductive/Obstetrics                             Anesthesia Physical  Anesthesia Plan  ASA: II  Anesthesia Plan: General   Post-op Pain Management:    Induction: Intravenous  PONV Risk Score and Plan: 4 or greater and Ondansetron, Dexamethasone, Midazolam, Droperidol and Treatment may vary due to age or medical condition  Airway Management Planned: LMA  Additional Equipment:   Intra-op Plan:   Post-operative Plan: Extubation in OR  Informed Consent:     Dental advisory given  Plan Discussed with: CRNA and Surgeon  Anesthesia Plan Comments:         Anesthesia Quick Evaluation

## 2021-09-29 NOTE — Anesthesia Postprocedure Evaluation (Signed)
Anesthesia Post Note  Patient: Mikia Delaluz  Procedure(s) Performed: RIGHT BREAST LUMPECTOMY (Right: Breast)     Patient location during evaluation: PACU Anesthesia Type: General Level of consciousness: awake and alert Pain management: pain level controlled Vital Signs Assessment: post-procedure vital signs reviewed and stable Respiratory status: spontaneous breathing, nonlabored ventilation and respiratory function stable Cardiovascular status: blood pressure returned to baseline and stable Postop Assessment: no apparent nausea or vomiting Anesthetic complications: no   No notable events documented.  Last Vitals:  Vitals:   09/29/21 1145 09/29/21 1225  BP: 105/78 129/84  Pulse: 79 79  Resp: 11 16  Temp:  36.5 C  SpO2: 98% 96%    Last Pain:  Vitals:   09/29/21 1225  TempSrc:   PainSc: 0-No pain                 Lynda Rainwater

## 2021-09-29 NOTE — Transfer of Care (Signed)
Immediate Anesthesia Transfer of Care Note  Patient: [name]  Procedure(s) Performed: RIGHT BREAST LUMPECTOMY (Right: Breast)  Patient Location: PACU  Anesthesia Type:General  Level of Consciousness: drowsy and patient cooperative  Airway & Oxygen Therapy: Patient Spontanous Breathing and Patient connected to face mask oxygen  Post-op Assessment: Report given to RN and Post -op Vital signs reviewed and stable  Post vital signs: Reviewed and stable  Last Vitals:  Vitals Value Taken Time  BP    Temp    Pulse 87 09/29/21 1140  Resp    SpO2 99 % 09/29/21 1140  Vitals shown include unvalidated device data.  Last Pain:  Vitals:   09/29/21 1040  TempSrc: Oral  PainSc: 2       Patients Stated Pain Goal: 5 (92/78/00 4471)  Complications: No notable events documented.

## 2021-09-30 ENCOUNTER — Encounter (HOSPITAL_BASED_OUTPATIENT_CLINIC_OR_DEPARTMENT_OTHER): Payer: Self-pay | Admitting: Surgery

## 2021-10-01 LAB — SURGICAL PATHOLOGY

## 2021-10-13 DIAGNOSIS — H3582 Retinal ischemia: Secondary | ICD-10-CM | POA: Diagnosis not present

## 2021-10-13 DIAGNOSIS — H35372 Puckering of macula, left eye: Secondary | ICD-10-CM | POA: Diagnosis not present

## 2021-10-13 DIAGNOSIS — H348312 Tributary (branch) retinal vein occlusion, right eye, stable: Secondary | ICD-10-CM | POA: Diagnosis not present

## 2021-10-13 DIAGNOSIS — H34812 Central retinal vein occlusion, left eye, with macular edema: Secondary | ICD-10-CM | POA: Diagnosis not present

## 2022-01-04 DIAGNOSIS — J40 Bronchitis, not specified as acute or chronic: Secondary | ICD-10-CM | POA: Diagnosis not present

## 2022-01-12 DIAGNOSIS — H348122 Central retinal vein occlusion, left eye, stable: Secondary | ICD-10-CM | POA: Diagnosis not present

## 2022-01-12 DIAGNOSIS — H3582 Retinal ischemia: Secondary | ICD-10-CM | POA: Diagnosis not present

## 2022-01-12 DIAGNOSIS — H35372 Puckering of macula, left eye: Secondary | ICD-10-CM | POA: Diagnosis not present

## 2022-01-12 DIAGNOSIS — H348312 Tributary (branch) retinal vein occlusion, right eye, stable: Secondary | ICD-10-CM | POA: Diagnosis not present

## 2022-03-01 DIAGNOSIS — J069 Acute upper respiratory infection, unspecified: Secondary | ICD-10-CM | POA: Diagnosis not present

## 2022-04-13 DIAGNOSIS — H35372 Puckering of macula, left eye: Secondary | ICD-10-CM | POA: Diagnosis not present

## 2022-04-13 DIAGNOSIS — H3582 Retinal ischemia: Secondary | ICD-10-CM | POA: Diagnosis not present

## 2022-04-13 DIAGNOSIS — H34812 Central retinal vein occlusion, left eye, with macular edema: Secondary | ICD-10-CM | POA: Diagnosis not present

## 2022-04-13 DIAGNOSIS — H348312 Tributary (branch) retinal vein occlusion, right eye, stable: Secondary | ICD-10-CM | POA: Diagnosis not present

## 2022-05-25 ENCOUNTER — Encounter (INDEPENDENT_AMBULATORY_CARE_PROVIDER_SITE_OTHER): Payer: Self-pay

## 2022-07-13 DIAGNOSIS — H35372 Puckering of macula, left eye: Secondary | ICD-10-CM | POA: Diagnosis not present

## 2022-07-13 DIAGNOSIS — H34831 Tributary (branch) retinal vein occlusion, right eye, with macular edema: Secondary | ICD-10-CM | POA: Diagnosis not present

## 2022-07-13 DIAGNOSIS — H3582 Retinal ischemia: Secondary | ICD-10-CM | POA: Diagnosis not present

## 2022-07-13 DIAGNOSIS — H34812 Central retinal vein occlusion, left eye, with macular edema: Secondary | ICD-10-CM | POA: Diagnosis not present

## 2022-07-20 DIAGNOSIS — Z0000 Encounter for general adult medical examination without abnormal findings: Secondary | ICD-10-CM | POA: Diagnosis not present

## 2022-07-20 DIAGNOSIS — R7303 Prediabetes: Secondary | ICD-10-CM | POA: Diagnosis not present

## 2022-07-20 DIAGNOSIS — I10 Essential (primary) hypertension: Secondary | ICD-10-CM | POA: Diagnosis not present

## 2022-07-20 DIAGNOSIS — Z23 Encounter for immunization: Secondary | ICD-10-CM | POA: Diagnosis not present

## 2022-07-20 DIAGNOSIS — E785 Hyperlipidemia, unspecified: Secondary | ICD-10-CM | POA: Diagnosis not present

## 2022-07-28 DIAGNOSIS — R7303 Prediabetes: Secondary | ICD-10-CM | POA: Diagnosis not present

## 2022-09-02 DIAGNOSIS — J069 Acute upper respiratory infection, unspecified: Secondary | ICD-10-CM | POA: Diagnosis not present

## 2022-10-12 DIAGNOSIS — H43813 Vitreous degeneration, bilateral: Secondary | ICD-10-CM | POA: Diagnosis not present

## 2022-10-12 DIAGNOSIS — H34831 Tributary (branch) retinal vein occlusion, right eye, with macular edema: Secondary | ICD-10-CM | POA: Diagnosis not present

## 2022-10-12 DIAGNOSIS — H34812 Central retinal vein occlusion, left eye, with macular edema: Secondary | ICD-10-CM | POA: Diagnosis not present

## 2022-11-02 DIAGNOSIS — R7303 Prediabetes: Secondary | ICD-10-CM | POA: Diagnosis not present

## 2023-01-11 DIAGNOSIS — H02839 Dermatochalasis of unspecified eye, unspecified eyelid: Secondary | ICD-10-CM | POA: Diagnosis not present

## 2023-01-11 DIAGNOSIS — H43813 Vitreous degeneration, bilateral: Secondary | ICD-10-CM | POA: Diagnosis not present

## 2023-01-11 DIAGNOSIS — H35033 Hypertensive retinopathy, bilateral: Secondary | ICD-10-CM | POA: Diagnosis not present

## 2023-01-11 DIAGNOSIS — H34812 Central retinal vein occlusion, left eye, with macular edema: Secondary | ICD-10-CM | POA: Diagnosis not present

## 2023-01-11 DIAGNOSIS — H34831 Tributary (branch) retinal vein occlusion, right eye, with macular edema: Secondary | ICD-10-CM | POA: Diagnosis not present

## 2023-03-28 DIAGNOSIS — Z01818 Encounter for other preprocedural examination: Secondary | ICD-10-CM | POA: Diagnosis not present

## 2023-03-28 DIAGNOSIS — H53483 Generalized contraction of visual field, bilateral: Secondary | ICD-10-CM | POA: Diagnosis not present

## 2023-03-28 DIAGNOSIS — H02831 Dermatochalasis of right upper eyelid: Secondary | ICD-10-CM | POA: Diagnosis not present

## 2023-03-28 DIAGNOSIS — H04123 Dry eye syndrome of bilateral lacrimal glands: Secondary | ICD-10-CM | POA: Diagnosis not present

## 2023-03-28 DIAGNOSIS — H02834 Dermatochalasis of left upper eyelid: Secondary | ICD-10-CM | POA: Diagnosis not present

## 2023-03-28 DIAGNOSIS — D485 Neoplasm of uncertain behavior of skin: Secondary | ICD-10-CM | POA: Diagnosis not present

## 2023-03-28 DIAGNOSIS — H0279 Other degenerative disorders of eyelid and periocular area: Secondary | ICD-10-CM | POA: Diagnosis not present

## 2023-04-25 DIAGNOSIS — Z01818 Encounter for other preprocedural examination: Secondary | ICD-10-CM | POA: Diagnosis not present

## 2023-04-25 DIAGNOSIS — D485 Neoplasm of uncertain behavior of skin: Secondary | ICD-10-CM | POA: Diagnosis not present

## 2023-04-25 DIAGNOSIS — H04123 Dry eye syndrome of bilateral lacrimal glands: Secondary | ICD-10-CM | POA: Diagnosis not present

## 2023-04-25 DIAGNOSIS — H53483 Generalized contraction of visual field, bilateral: Secondary | ICD-10-CM | POA: Diagnosis not present

## 2023-04-25 DIAGNOSIS — H0279 Other degenerative disorders of eyelid and periocular area: Secondary | ICD-10-CM | POA: Diagnosis not present

## 2023-04-25 DIAGNOSIS — R599 Enlarged lymph nodes, unspecified: Secondary | ICD-10-CM | POA: Diagnosis not present

## 2023-04-25 DIAGNOSIS — H05222 Edema of left orbit: Secondary | ICD-10-CM | POA: Diagnosis not present

## 2023-05-05 DIAGNOSIS — H34831 Tributary (branch) retinal vein occlusion, right eye, with macular edema: Secondary | ICD-10-CM | POA: Diagnosis not present

## 2023-05-05 DIAGNOSIS — H02839 Dermatochalasis of unspecified eye, unspecified eyelid: Secondary | ICD-10-CM | POA: Diagnosis not present

## 2023-05-05 DIAGNOSIS — H43813 Vitreous degeneration, bilateral: Secondary | ICD-10-CM | POA: Diagnosis not present

## 2023-05-05 DIAGNOSIS — H35033 Hypertensive retinopathy, bilateral: Secondary | ICD-10-CM | POA: Diagnosis not present

## 2023-05-05 DIAGNOSIS — H34812 Central retinal vein occlusion, left eye, with macular edema: Secondary | ICD-10-CM | POA: Diagnosis not present

## 2023-05-16 DIAGNOSIS — Z09 Encounter for follow-up examination after completed treatment for conditions other than malignant neoplasm: Secondary | ICD-10-CM | POA: Diagnosis not present

## 2023-06-07 IMAGING — MG MM BREAST LOCALIZATION CLIP
4 series · 4 of 12 positions shown · non-contrast
Comparison: Previous exam(s).

CLINICAL DATA: Status post ultrasound-guided core biopsy of RIGHT
breast mass.

EXAM:
3D DIAGNOSTIC RIGHT MAMMOGRAM POST ULTRASOUND BIOPSY

[R MLO synth-2D (1 of 2)]
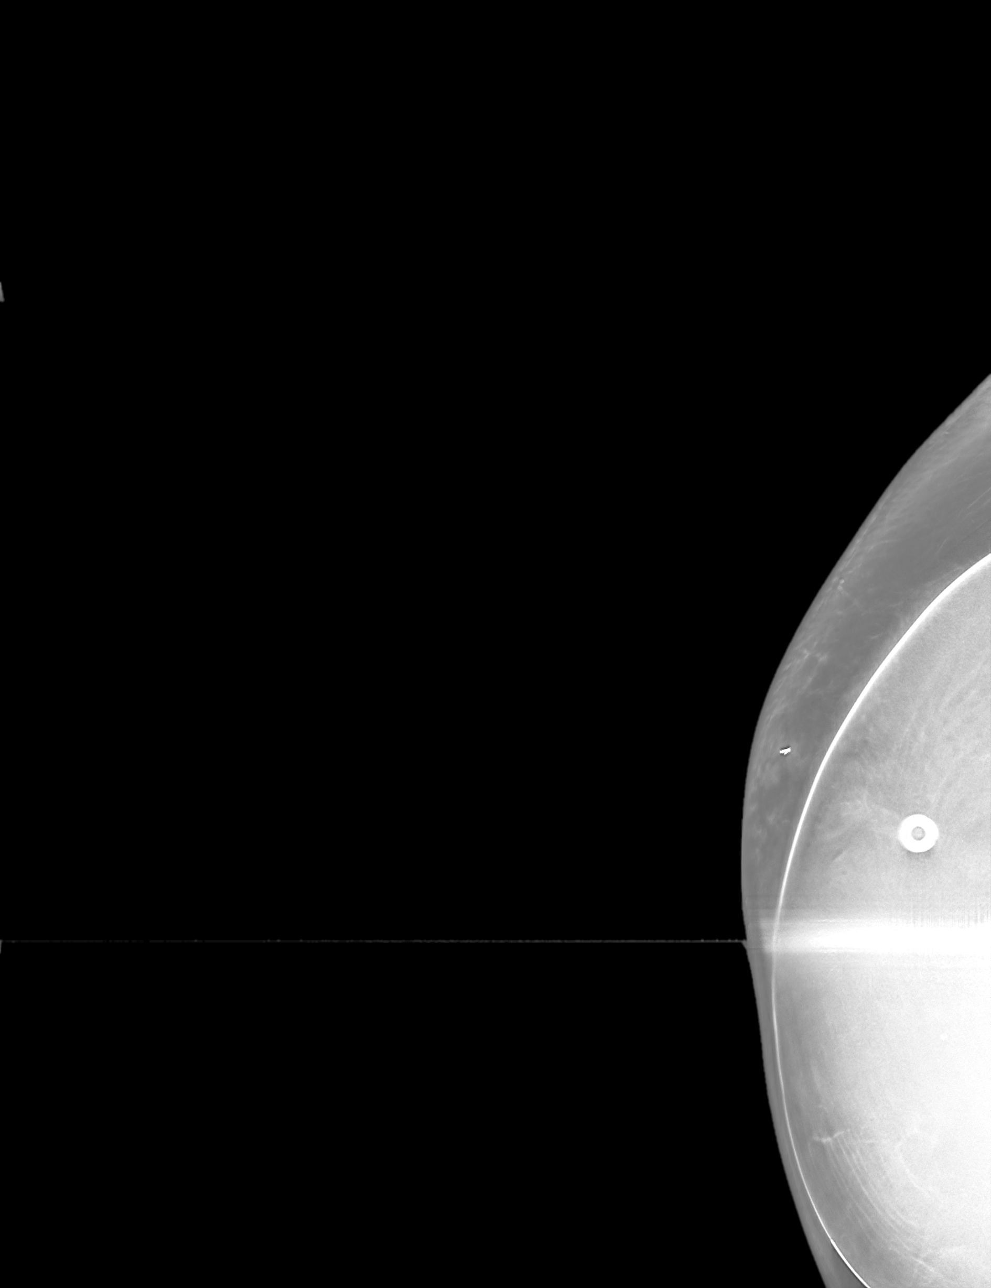

[R MLO synth-2D (2 of 2)]
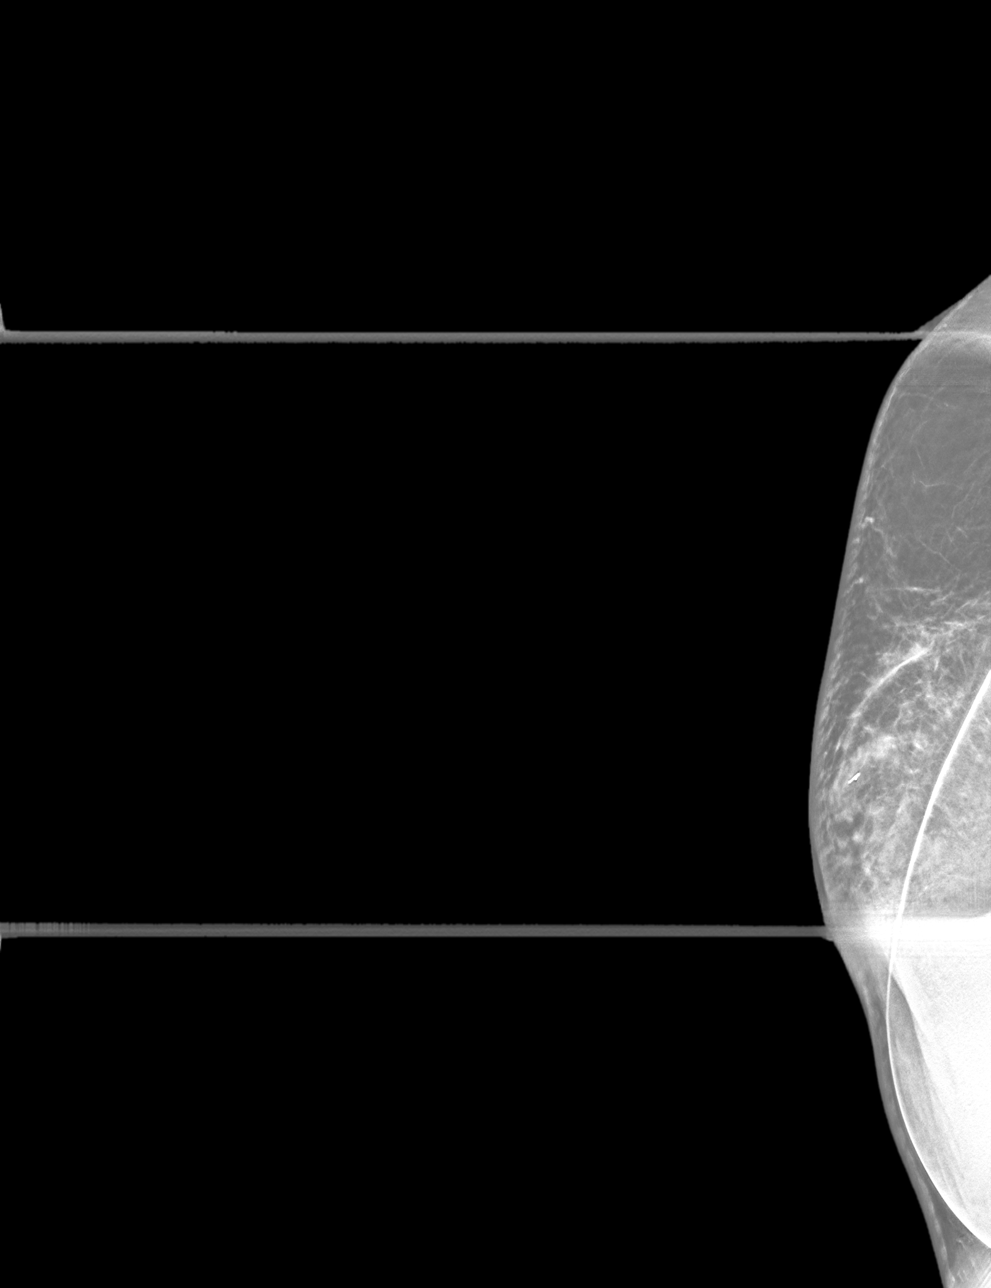

[R MLO tomo (1 of 2) · tomo slice 20/39.0]
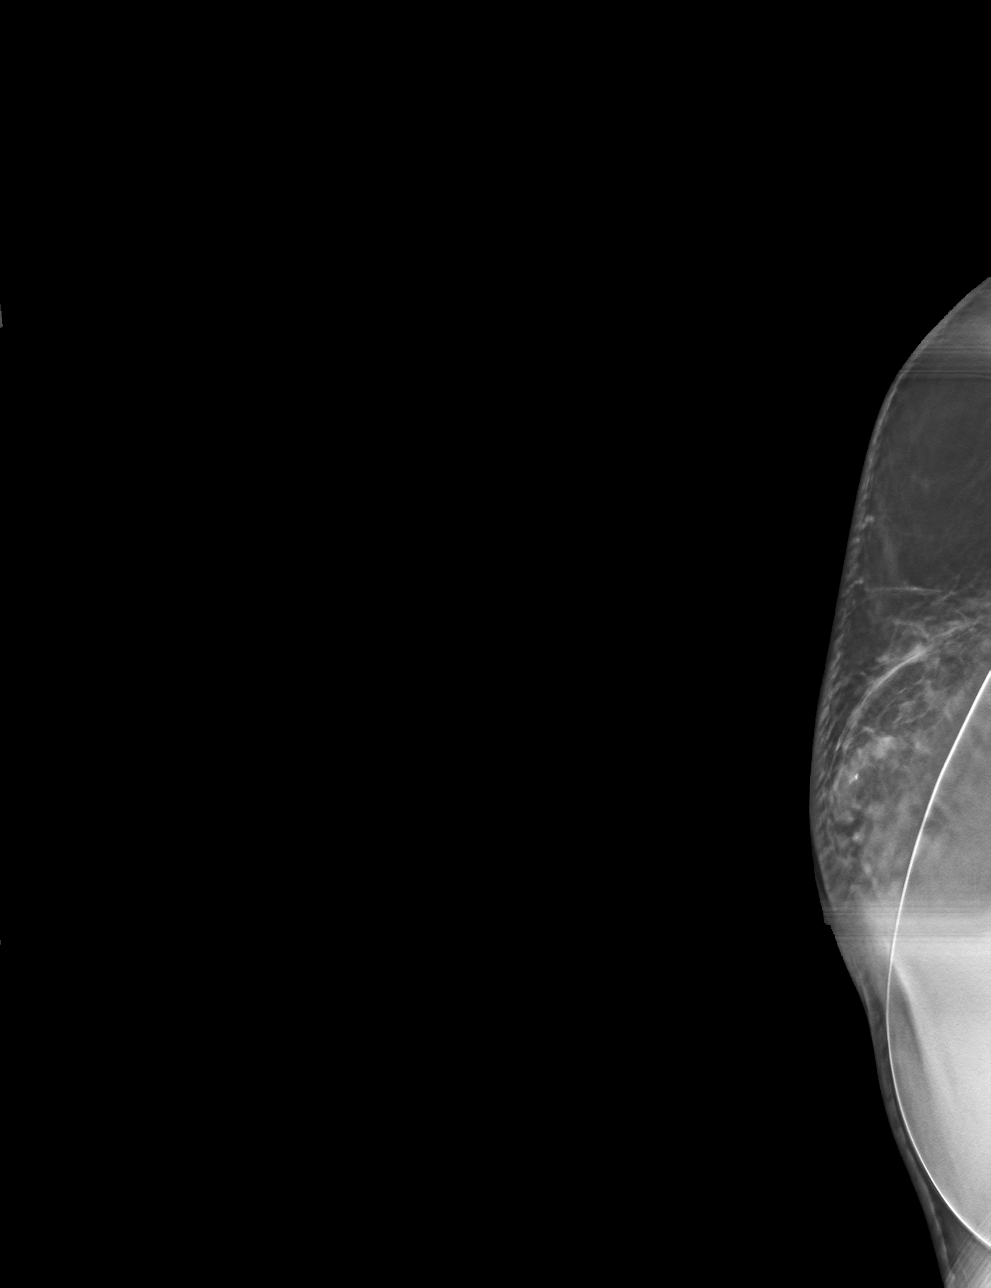

[R MLO tomo (2 of 2) · tomo slice 37/73.0]
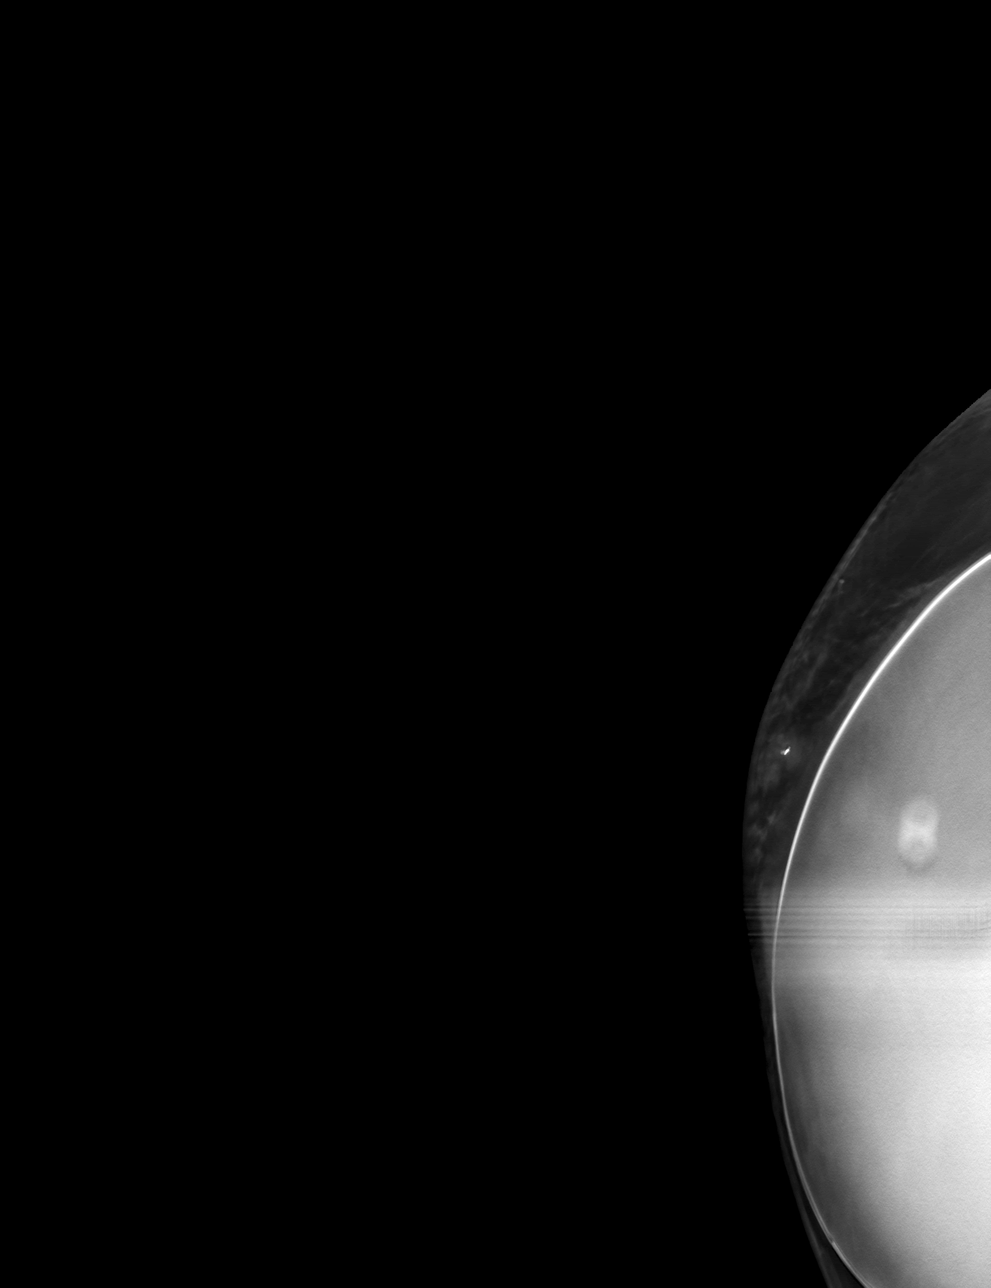

[4 of 12 positions shown; findings below may reference images not displayed]

FINDINGS: 3D Mammographic images were obtained following ultrasound guided
biopsy of mass in the 12 o'clock location of the RIGHT breast and
placement of a ribbon shaped clip. The biopsy marking clip is in
expected position at the site of biopsy.
IMPRESSION: Appropriate positioning of the ribbon shaped biopsy marking clip at
the site of biopsy in the UPPER central RIGHT breast.

Final Assessment: Post Procedure Mammograms for Marker Placement

## 2023-06-26 DIAGNOSIS — H53483 Generalized contraction of visual field, bilateral: Secondary | ICD-10-CM | POA: Diagnosis not present

## 2023-07-26 DIAGNOSIS — R7303 Prediabetes: Secondary | ICD-10-CM | POA: Diagnosis not present

## 2023-07-26 DIAGNOSIS — I10 Essential (primary) hypertension: Secondary | ICD-10-CM | POA: Diagnosis not present

## 2023-07-26 DIAGNOSIS — E785 Hyperlipidemia, unspecified: Secondary | ICD-10-CM | POA: Diagnosis not present

## 2023-07-26 DIAGNOSIS — Z0000 Encounter for general adult medical examination without abnormal findings: Secondary | ICD-10-CM | POA: Diagnosis not present

## 2023-07-26 DIAGNOSIS — M8588 Other specified disorders of bone density and structure, other site: Secondary | ICD-10-CM | POA: Diagnosis not present

## 2023-07-26 DIAGNOSIS — G4709 Other insomnia: Secondary | ICD-10-CM | POA: Diagnosis not present

## 2023-07-27 ENCOUNTER — Other Ambulatory Visit: Payer: Self-pay | Admitting: Family Medicine

## 2023-07-27 DIAGNOSIS — M8589 Other specified disorders of bone density and structure, multiple sites: Secondary | ICD-10-CM

## 2023-08-10 DIAGNOSIS — I10 Essential (primary) hypertension: Secondary | ICD-10-CM | POA: Diagnosis not present

## 2023-08-15 DIAGNOSIS — H02834 Dermatochalasis of left upper eyelid: Secondary | ICD-10-CM | POA: Diagnosis not present

## 2023-08-15 DIAGNOSIS — H02422 Myogenic ptosis of left eyelid: Secondary | ICD-10-CM | POA: Diagnosis not present

## 2023-08-15 DIAGNOSIS — H02421 Myogenic ptosis of right eyelid: Secondary | ICD-10-CM | POA: Diagnosis not present

## 2023-08-15 DIAGNOSIS — H0279 Other degenerative disorders of eyelid and periocular area: Secondary | ICD-10-CM | POA: Diagnosis not present

## 2023-08-15 DIAGNOSIS — H53483 Generalized contraction of visual field, bilateral: Secondary | ICD-10-CM | POA: Diagnosis not present

## 2023-08-15 DIAGNOSIS — H05223 Edema of bilateral orbit: Secondary | ICD-10-CM | POA: Diagnosis not present

## 2023-08-15 DIAGNOSIS — R590 Localized enlarged lymph nodes: Secondary | ICD-10-CM | POA: Diagnosis not present

## 2023-08-15 DIAGNOSIS — Z01818 Encounter for other preprocedural examination: Secondary | ICD-10-CM | POA: Diagnosis not present

## 2023-08-15 DIAGNOSIS — H02423 Myogenic ptosis of bilateral eyelids: Secondary | ICD-10-CM | POA: Diagnosis not present

## 2023-08-23 DIAGNOSIS — H34812 Central retinal vein occlusion, left eye, with macular edema: Secondary | ICD-10-CM | POA: Diagnosis not present

## 2023-08-23 DIAGNOSIS — H35033 Hypertensive retinopathy, bilateral: Secondary | ICD-10-CM | POA: Diagnosis not present

## 2023-08-23 DIAGNOSIS — H02839 Dermatochalasis of unspecified eye, unspecified eyelid: Secondary | ICD-10-CM | POA: Diagnosis not present

## 2023-08-23 DIAGNOSIS — H34831 Tributary (branch) retinal vein occlusion, right eye, with macular edema: Secondary | ICD-10-CM | POA: Diagnosis not present

## 2023-08-23 DIAGNOSIS — H43813 Vitreous degeneration, bilateral: Secondary | ICD-10-CM | POA: Diagnosis not present

## 2023-12-13 DIAGNOSIS — H31091 Other chorioretinal scars, right eye: Secondary | ICD-10-CM | POA: Diagnosis not present

## 2023-12-13 DIAGNOSIS — H35033 Hypertensive retinopathy, bilateral: Secondary | ICD-10-CM | POA: Diagnosis not present

## 2023-12-13 DIAGNOSIS — H348312 Tributary (branch) retinal vein occlusion, right eye, stable: Secondary | ICD-10-CM | POA: Diagnosis not present

## 2023-12-13 DIAGNOSIS — H34812 Central retinal vein occlusion, left eye, with macular edema: Secondary | ICD-10-CM | POA: Diagnosis not present

## 2023-12-13 DIAGNOSIS — H43813 Vitreous degeneration, bilateral: Secondary | ICD-10-CM | POA: Diagnosis not present

## 2024-01-03 DIAGNOSIS — R0609 Other forms of dyspnea: Secondary | ICD-10-CM | POA: Diagnosis not present

## 2024-03-06 ENCOUNTER — Ambulatory Visit
Admission: RE | Admit: 2024-03-06 | Discharge: 2024-03-06 | Disposition: A | Discharge status: Home / Self Care | Payer: Medicare Other | Source: Ambulatory Visit | Attending: Family Medicine | Admitting: Family Medicine

## 2024-03-06 DIAGNOSIS — M8588 Other specified disorders of bone density and structure, other site: Secondary | ICD-10-CM | POA: Diagnosis not present

## 2024-03-06 DIAGNOSIS — M8589 Other specified disorders of bone density and structure, multiple sites: Secondary | ICD-10-CM

## 2024-03-18 ENCOUNTER — Other Ambulatory Visit: Payer: Self-pay

## 2024-03-18 DIAGNOSIS — Z91018 Allergy to other foods: Secondary | ICD-10-CM | POA: Insufficient documentation

## 2024-03-18 DIAGNOSIS — R0609 Other forms of dyspnea: Secondary | ICD-10-CM | POA: Insufficient documentation

## 2024-03-18 DIAGNOSIS — E785 Hyperlipidemia, unspecified: Secondary | ICD-10-CM | POA: Insufficient documentation

## 2024-03-18 DIAGNOSIS — Z9889 Other specified postprocedural states: Secondary | ICD-10-CM | POA: Insufficient documentation

## 2024-03-18 DIAGNOSIS — R203 Hyperesthesia: Secondary | ICD-10-CM | POA: Insufficient documentation

## 2024-03-18 DIAGNOSIS — I10 Essential (primary) hypertension: Secondary | ICD-10-CM | POA: Insufficient documentation

## 2024-03-18 DIAGNOSIS — E7800 Pure hypercholesterolemia, unspecified: Secondary | ICD-10-CM | POA: Insufficient documentation

## 2024-03-18 DIAGNOSIS — J302 Other seasonal allergic rhinitis: Secondary | ICD-10-CM | POA: Insufficient documentation

## 2024-03-18 DIAGNOSIS — M170 Bilateral primary osteoarthritis of knee: Secondary | ICD-10-CM | POA: Insufficient documentation

## 2024-03-18 DIAGNOSIS — K5900 Constipation, unspecified: Secondary | ICD-10-CM | POA: Insufficient documentation

## 2024-03-18 DIAGNOSIS — M2550 Pain in unspecified joint: Secondary | ICD-10-CM | POA: Insufficient documentation

## 2024-03-19 ENCOUNTER — Ambulatory Visit

## 2024-03-19 VITALS — BP 144/88 | HR 87 | Ht 64.0 in | Wt 200.1 lb

## 2024-03-19 DIAGNOSIS — R0609 Other forms of dyspnea: Secondary | ICD-10-CM

## 2024-03-19 DIAGNOSIS — I10 Essential (primary) hypertension: Secondary | ICD-10-CM | POA: Diagnosis not present

## 2024-03-19 DIAGNOSIS — R931 Abnormal findings on diagnostic imaging of heart and coronary circulation: Secondary | ICD-10-CM | POA: Diagnosis not present

## 2024-03-19 MED ORDER — ASPIRIN 81 MG PO TBEC: 81.0000 mg | DELAYED_RELEASE_TABLET | Freq: Every day | ORAL | 3 refills | Status: AC

## 2024-03-19 MED ORDER — TRIAMTERENE-HCTZ 37.5-25 MG PO TABS: 0.5000 | ORAL_TABLET | Freq: Every day | ORAL | 3 refills | Status: AC

## 2024-03-19 MED ORDER — METOPROLOL TARTRATE 100 MG PO TABS: 100.0000 mg | ORAL_TABLET | Freq: Once | ORAL | 0 refills | Status: DC

## 2024-03-19 NOTE — Assessment & Plan Note (Addendum)
 Associated with chest pressure on exertion. Relieved with rest. Anginal equivalent. Has cardiovascular risk factors in the form of age, prediabetes, obesity, family history.  Differential diagnosis is cardiac causes angina from CAD versus CHF versus noncardiac causes such as pulmonary etiology from the bronchitis versus deconditioning.  Will proceed from cardiac standpoint with transthoracic echocardiogram and cardiac CT coronary angiogram.  If these are unremarkable would recommend further pulmonary evaluation with complete lung function test and optimization.  While cardiac evaluation is being completed I requested her to start taking aspirin 81 mg once daily.

## 2024-03-19 NOTE — Progress Notes (Signed)
 Cardiology Consultation:    Date:  03/19/2024   ID:  [name], DOB 1951-05-29, MRN [id number]  PCP:  Brittney Mount, DO (Inactive)  Cardiologist:  Brittney Kells, MD   Referring MD: Brittney Romans, FNP   No chief complaint on file.    ASSESSMENT AND PLAN:   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  No prior history of CAD, CHF, MI, CVA. With gradually progressive symptoms of dyspnea on exertion, relieving with rest.  Problem List Items Addressed This Visit     Dyspnea on exertion - Primary   Associated with chest pressure on exertion. Relieved with rest. Anginal equivalent. Has cardiovascular risk factors in the form of age, prediabetes, obesity, family history.  Differential diagnosis is cardiac causes angina from CAD versus CHF versus noncardiac causes such as pulmonary etiology from the bronchitis versus deconditioning.  Will proceed from cardiac standpoint with transthoracic echocardiogram and cardiac CT coronary angiogram.  If these are unremarkable would recommend further pulmonary evaluation with complete lung function test and optimization.  While cardiac evaluation is being completed I requested her to start taking aspirin 81 mg once daily.       Relevant Orders   EKG 12-Lead (Completed)   Hypertension   Suboptimally controlled today. Continue amlodipine 5 mg once daily.  Advised to continue taking triamterene-hydrochlorothiazide consistently and recommended switching the dose to the tablet formulation and take half a tablet a day [for an effective dose of 18.75-12.5 mg daily]      Relevant Medications   triamterene-hydrochlorothiazide (MAXZIDE-25) 37.5-25 MG tablet   Return to clinic tentatively in 2 months for follow-up.   History  of Present Illness:    [name] is a 73 y.o. female who is being seen today for the evaluation of new on exertion and reduced exercise tolerance at the request of Stamey, Lowell Rude, FNP.  Pleasant woman here for the visit by herself. Lives with her husband at home.  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 No prior history of CAD, CHF, MI, CVA.  Reported symptoms of shortness of breath with exertion, notably while walking up an incline or walking while carrying objects.  Relieved with rest.  Has been occurring more noticeably with less and less effort for the last few months. At times associated with a sense of chest heaviness. Denies any palpitations.  For blood pressure she is on amlodipine and triamterene-hydrochlorothiazide.  However she takes triamterene-hydrochlorothiazide only 2-3 times a week due to symptoms of muscle cramps. Does not routinely check blood pressures at home.  Reports an episode of bronchitis earlier in April that gradually subsided..  Does report significant family history of coronary artery disease around ages 73s to 34s. Denies smoking. Drinks alcohol greatly on social occasions. No recreational drug use   Pulmonary function test from 01-03-2024 reported probable restriction and further evaluation recommended as I do not see lung volumes reported with the study.  EKG in the clinic today shows sinus rhythm heart rate 87/min, mildly prolonged PR interval 208 ms, QRS duration 76 ms.  Biatrial enlargement suggested by P wave morphology.  No significant ischemic changes  Last blood work to review from [facility] was 07/26/2023 with total cholesterol 250, HDL 62, LDL 159, triglycerides 162. Hemoglobin A1c  6.3, so this prediabetes. Hemoglobin 14.9. Creatinine 0.83.  Past Medical History:  Diagnosis Date   Complication of breast  implant 06/2013   Constipation    Dyspnea on exertion    Food allergy    GERD (gastroesophageal reflux disease)    GERD (gastroesophageal reflux disease)    High cholesterol    Hypertension    under control with med., has been on med. x 5 yr.   Joint pain    Osteoarthritis of both knees    PONV (postoperative nausea and vomiting)    Prediabetes 07/23/2019   Seasonal allergies    Sensitive skin     Past Surgical History:  Procedure Laterality Date   ABDOMINAL HYSTERECTOMY     partial - age 22   APPENDECTOMY     BREAST BIOPSY     x 2   BREAST CAPSULECTOMY WITH IMPLANT EXCHANGE Right 09/17/2007   with exc. accessory breast tissue right breast and axilla   BREAST CAPSULECTOMY WITH IMPLANT EXCHANGE Bilateral 07/22/2013   Procedure: BILATERAL BREAST CAPSULECTOMY WITH IMPLANT EXCHANGE AND RIGHT RECONSTRUCTION/REVISION;  Surgeon: Phyllis Breeze, MD;  Location: [facility];  Service: Plastics;  Laterality: Bilateral;   BREAST LUMPECTOMY Right 09/29/2021   Procedure: RIGHT BREAST LUMPECTOMY;  Surgeon: Oza Blumenthal, MD;  Location: [facility];  Service: General;  Laterality: Right;   KNEE ARTHROSCOPY Right 09/12/2001   MASTECTOMY Bilateral    PLACEMENT OF BREAST IMPLANTS     after mastectomy   REMOVAL OF BILATERAL TISSUE EXPANDERS WITH PLACEMENT OF BILATERAL BREAST IMPLANTS     TONSILLECTOMY     age 74   TOTAL KNEE ARTHROPLASTY Right 10/25/2002   TOTAL KNEE ARTHROPLASTY Right     Current Medications: Current Meds  Medication Sig   amLODipine (NORVASC) 5 MG tablet Take 5 mg by mouth daily.   atorvastatin (LIPITOR) 20 MG tablet Take 20 mg by mouth daily.   Cholecalciferol (VITAMIN D3) 50 MCG (2000 UT) capsule Take 2,000 Units by mouth daily.   Cyanocobalamin 3000 MCG/ML LIQD Place 1 each under the tongue daily.   diclofenac (VOLTAREN) 75 MG EC tablet Take 75 mg by mouth 2 (two) times daily.   famotidine (PEPCID) 20 MG tablet Take 20 mg by mouth  daily as needed for heartburn or indigestion.   loratadine (CLARITIN) 10 MG tablet Take 10 mg by mouth daily.   traZODone (DESYREL) 50 MG tablet Take 50 mg by mouth at bedtime.   triamterene-hydrochlorothiazide (MAXZIDE-25) 37.5-25 MG tablet Take 0.5 tablets by mouth daily.   [DISCONTINUED] triamterene-hydrochlorothiazide (DYAZIDE) 37.5-25 MG per capsule Take 1 capsule by mouth every morning.     Allergies:   Patient has no known allergies.   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  Tobacco Use   Smoking status: Never   Smokeless tobacco: Never  Substance and Sexual Activity   Alcohol use: No   Drug use: No   Sexual activity: Not on file  Other Topics Concern   Not on file  Social History Narrative   Not on file   Social Drivers of Health   Financial Resource Strain: Not on file  Food Insecurity: Not on file  Transportation Needs: Not on file  Physical Activity:  Not on file  Stress: Not on file  Social Connections: Not on file     Family History: The patient's family history includes Anxiety disorder in her mother; Diabetes in her mother; Heart disease in her father; High blood pressure in her mother. ROS:   Please see the history of present illness.    All 14 point review of systems negative except as described per history of present illness.  EKGs/Labs/Other Studies Reviewed:    The following studies were reviewed today:   EKG:  EKG Interpretation Date/Time:  Tuesday March 19 2024 14:41:34 EDT Ventricular Rate:  87 PR Interval:  208 QRS Duration:  76 QT Interval:  382 QTC Calculation: 459 R Axis:   60  Text Interpretation: Normal sinus rhythm Biatrial enlargement When compared with ECG of 21-Sep-2021 12:28, No significant change was found  Confirmed by Bertha Broad reddy [phone]) on 03/19/2024 2:47:51 PM    Recent Labs: No results found for requested labs within last 365 days.  Recent Lipid Panel    Component Value Date/Time   CHOL 261 (H) 07/22/2019 1037   TRIG 157 (H) 07/22/2019 1037   HDL 62 07/22/2019 1037   LDLCALC 171 (H) 07/22/2019 1037    Physical Exam:    VS:  BP (!) 144/88   Pulse 87   Ht 5\' 4"  (1.626 m)   Wt 200 lb 1.3 oz (90.8 kg)   SpO2 94%   BMI 34.34 kg/m     Wt Readings from Last 3 Encounters:  03/19/24 200 lb 1.3 oz (90.8 kg)  09/29/21 185 lb 6.5 oz (84.1 kg)  09/10/19 172 lb (78 kg)     GENERAL:  Well nourished, well developed in no acute distress NECK: No JVD; No carotid bruits CARDIAC: RRR, S1 and S2 present, no murmurs, no rubs, no gallops CHEST:  Clear to auscultation without rales, wheezing or rhonchi  Extremities: No pitting pedal edema. Pulses bilaterally symmetric with radial 2+ and dorsalis pedis 2+ NEUROLOGIC:  Alert and oriented x 3  Medication Adjustments/Labs and Tests Ordered: Current medicines are reviewed at length with the patient today.  Concerns regarding medicines are outlined above.  Orders Placed This Encounter  Procedures   EKG 12-Lead   Meds ordered this encounter  Medications   triamterene-hydrochlorothiazide (MAXZIDE-25) 37.5-25 MG tablet    Sig: Take 0.5 tablets by mouth daily.    Dispense:  45 tablet    Refill:  3    Signed, Damesha Lawler reddy Rio Taber, MD, MPH, [facility]. 03/19/2024 3:11 PM    [facility]

## 2024-03-19 NOTE — Assessment & Plan Note (Addendum)
 Suboptimally controlled today. Continue amlodipine 5 mg once daily.  Advised to continue taking triamterene-hydrochlorothiazide consistently and recommended switching the dose to the tablet formulation and take half a tablet a day [for an effective dose of 18.75-12.5 mg daily]

## 2024-03-19 NOTE — Patient Instructions (Signed)
 Medication Instructions:  Your physician has recommended you make the following change in your medication:   START: Aspirin 81 mg daily START: Triamterene / Hydrochlorothiazide 37.5/ 25 mg 1 tablet daily.  *If you need a refill on your cardiac medications before your next appointment, please call your pharmacy*  Lab Work: Your physician recommends that you return for lab work in:   Labs today:CMP, CBC  If you have labs (blood work) drawn today and your tests are completely normal, you will receive your results only by: MyChart Message (if you have MyChart) OR A paper copy in the mail If you have any lab test that is abnormal or we need to change your treatment, we will call you to review the results.  Testing/Procedures: Your physician has requested that you have an echocardiogram. Echocardiography is a painless test that uses sound waves to create images of your heart. It provides your doctor with information about the size and shape of your heart and how well your heart's chambers and valves are working. This procedure takes approximately one hour. There are no restrictions for this procedure. Please do NOT wear cologne, perfume, aftershave, or lotions (deodorant is allowed). Please arrive 15 minutes prior to your appointment time.  Please note: We ask at that you not bring children with you during ultrasound (echo/ vascular) testing. Due to room size and safety concerns, children are not allowed in the ultrasound rooms during exams. Our front office staff cannot provide observation of children in our lobby area while testing is being conducted. An adult accompanying a patient to their appointment will only be allowed in the ultrasound room at the discretion of the ultrasound technician under special circumstances. We apologize for any inconvenience.    Your cardiac CT will be scheduled at one of the below locations:   [facility] [address] [place], [place]  [address] [phone]  OR  [facility] [address] [place], [place] [address] [phone]  OR   [facility] [address] [place], [place] [address] [phone]  OR   [facility] [address] [place], [place] [address] [phone]  OR   [facility]. [facility] and Vascular Tower [address]  [place], [place] [address] Opening February 12, 2024  If scheduled at [facility], please arrive at the [facility] and Children's Entrance (Entrance C2) of [facility] 30 minutes prior to test start time. You can use the FREE valet parking offered at entrance C (encouraged to control the heart rate for the test)  Proceed to the [facility] Radiology Department (first floor) to check-in and test prep.   All radiology patients and guests should use entrance C2 at [facility], accessed from [facility], even though the hospital's physical address listed is [address]    If scheduled at the Heart and Vascular Tower at [address], please enter the parking lot using the [address] entrance and use the FREE valet service at the patient drop-off area. Enter the buidling and check-in with registration on the main floor.  If scheduled at [facility] or [facility], please arrive 15 mins early for check-in and test prep.  There is spacious parking and easy access to the radiology department from the [facility] Heart and Vascular entrance. Please enter here and check-in with the desk attendant.   If scheduled at [facility], please  arrive 30 minutes early for check-in and test prep.  Please follow these instructions carefully (unless otherwise directed):  An IV will be required for this test and Nitroglycerin will be given.  Hold all erectile dysfunction medications at least 3 days (72 hrs) prior to  test. (Ie viagra, cialis, sildenafil, tadalafil, etc)   On the Night Before the Test: Be sure to Drink plenty of water. Do not consume any caffeinated/decaffeinated beverages or chocolate 12 hours prior to your test. Do not take any antihistamines 12 hours prior to your test.  On the Day of the Test: Drink plenty of water until 1 hour prior to the test. Do not eat any food 1 hour prior to test. You may take your regular medications prior to the test.  Take metoprolol (Lopressor) two hours prior to test. If you take Triamterene/ Hydrochlorothiazide please HOLD on the morning of the test. Patients who wear a continuous glucose monitor MUST remove the device prior to scanning. FEMALES- please wear underwire-free bra if available, avoid dresses & tight clothing       After the Test: Drink plenty of water. After receiving IV contrast, you may experience a mild flushed feeling. This is normal. On occasion, you may experience a mild rash up to 24 hours after the test. This is not dangerous. If this occurs, you can take Benadryl 25 mg, Zyrtec, Claritin, or Allegra and increase your fluid intake. (Patients taking Tikosyn should avoid Benadryl, and may take Zyrtec, Claritin, or Allegra) If you experience trouble breathing, this can be serious. If it is severe call [phone] IMMEDIATELY. If it is mild, please call our office.  We will call to schedule your test 2-4 weeks out understanding that some insurance companies will need an authorization prior to the service being performed.   For more information and frequently asked questions, please visit our website : [email or web address]  For non-scheduling related questions, please contact the cardiac imaging nurse navigator should you have any questions/concerns: Cardiac Imaging Nurse Navigators Direct Office Dial: [phone]   For scheduling needs, including cancellations and rescheduling, please call [place],  [phone].   Follow-Up: At [facility], you and your health needs are our priority.  As part of our continuing mission to provide you with exceptional heart care, our providers are all part of one team.  This team includes your primary Cardiologist (physician) and Advanced Practice Providers or APPs (Physician Assistants and Nurse Practitioners) who all work together to provide you with the care you need, when you need it.  Your next appointment:   2 month(s)  Provider:   Bertha Broad, MD    We recommend signing up for the patient portal called "MyChart".  Sign up information is provided on this After Visit Summary.  MyChart is used to connect with patients for Virtual Visits (Telemedicine).  Patients are able to view lab/test results, encounter notes, upcoming appointments, etc.  Non-urgent messages can be sent to your provider as well.   To learn more about what you can do with MyChart, go to [email or web address].   Other Instructions None

## 2024-03-20 LAB — CBC
Hematocrit: 46.7 % — ABNORMAL HIGH (ref 34.0–46.6)
Hemoglobin: 15.2 g/dL (ref 11.1–15.9)
MCH: 27.1 pg (ref 26.6–33.0)
MCHC: 32.5 g/dL (ref 31.5–35.7)
MCV: 83 fL (ref 79–97)
Platelets: 262 10*3/uL (ref 150–450)
RBC: 5.6 x10E6/uL — ABNORMAL HIGH (ref 3.77–5.28)
RDW: 14.9 % (ref 11.7–15.4)
WBC: 9.1 10*3/uL (ref 3.4–10.8)

## 2024-03-20 LAB — COMPREHENSIVE METABOLIC PANEL WITH GFR
ALT: 53 IU/L — ABNORMAL HIGH (ref 0–32)
AST: 39 IU/L (ref 0–40)
Albumin: 4.7 g/dL (ref 3.8–4.8)
Alkaline Phosphatase: 97 IU/L (ref 44–121)
BUN/Creatinine Ratio: 16 (ref 12–28)
BUN: 16 mg/dL (ref 8–27)
Bilirubin Total: 0.3 mg/dL (ref 0.0–1.2)
CO2: 21 mmol/L (ref 20–29)
Calcium: 9.7 mg/dL (ref 8.7–10.3)
Chloride: 102 mmol/L (ref 96–106)
Creatinine, Ser: 1.03 mg/dL — ABNORMAL HIGH (ref 0.57–1.00)
Globulin, Total: 3 g/dL (ref 1.5–4.5)
Glucose: 115 mg/dL — ABNORMAL HIGH (ref 70–99)
Potassium: 3.8 mmol/L (ref 3.5–5.2)
Sodium: 141 mmol/L (ref 134–144)
Total Protein: 7.7 g/dL (ref 6.0–8.5)
eGFR: 57 mL/min/{1.73_m2} — ABNORMAL LOW (ref >59)

## 2024-04-03 DIAGNOSIS — H31093 Other chorioretinal scars, bilateral: Secondary | ICD-10-CM | POA: Diagnosis not present

## 2024-04-03 DIAGNOSIS — H43813 Vitreous degeneration, bilateral: Secondary | ICD-10-CM | POA: Diagnosis not present

## 2024-04-03 DIAGNOSIS — H35033 Hypertensive retinopathy, bilateral: Secondary | ICD-10-CM | POA: Diagnosis not present

## 2024-04-03 DIAGNOSIS — H348312 Tributary (branch) retinal vein occlusion, right eye, stable: Secondary | ICD-10-CM | POA: Diagnosis not present

## 2024-04-03 DIAGNOSIS — H26491 Other secondary cataract, right eye: Secondary | ICD-10-CM | POA: Diagnosis not present

## 2024-04-03 DIAGNOSIS — H34812 Central retinal vein occlusion, left eye, with macular edema: Secondary | ICD-10-CM | POA: Diagnosis not present

## 2024-04-05 ENCOUNTER — Encounter (HOSPITAL_COMMUNITY): Payer: Self-pay

## 2024-04-09 ENCOUNTER — Ambulatory Visit (HOSPITAL_BASED_OUTPATIENT_CLINIC_OR_DEPARTMENT_OTHER)
Admission: RE | Admit: 2024-04-09 | Discharge: 2024-04-09 | Disposition: A | Discharge status: Home / Self Care | Source: Ambulatory Visit

## 2024-04-09 ENCOUNTER — Encounter (HOSPITAL_BASED_OUTPATIENT_CLINIC_OR_DEPARTMENT_OTHER): Payer: Self-pay

## 2024-04-09 VITALS — BP 143/68 | HR 60

## 2024-04-09 DIAGNOSIS — R0609 Other forms of dyspnea: Secondary | ICD-10-CM | POA: Diagnosis not present

## 2024-04-09 DIAGNOSIS — I10 Essential (primary) hypertension: Secondary | ICD-10-CM | POA: Insufficient documentation

## 2024-04-09 DIAGNOSIS — I25119 Atherosclerotic heart disease of native coronary artery with unspecified angina pectoris: Secondary | ICD-10-CM

## 2024-04-09 DIAGNOSIS — K449 Diaphragmatic hernia without obstruction or gangrene: Secondary | ICD-10-CM | POA: Diagnosis not present

## 2024-04-09 DIAGNOSIS — I2510 Atherosclerotic heart disease of native coronary artery without angina pectoris: Secondary | ICD-10-CM | POA: Insufficient documentation

## 2024-04-09 MED ORDER — NITROGLYCERIN 0.4 MG SL SUBL
SUBLINGUAL_TABLET | SUBLINGUAL | Status: AC
  Administered 2024-04-09: 0.8 mg via SUBLINGUAL
  Filled 2024-04-09: qty 12

## 2024-04-09 MED ORDER — METOPROLOL TARTRATE 5 MG/5ML IV SOLN
INTRAVENOUS | Status: AC
  Filled 2024-04-09: qty 20

## 2024-04-09 MED ORDER — NITROGLYCERIN 0.4 MG SL SUBL: 0.8000 mg | SUBLINGUAL_TABLET | Freq: Once | SUBLINGUAL | Status: AC

## 2024-04-09 MED ORDER — IOHEXOL 350 MG/ML SOLN
100.0000 mL | Freq: Once | INTRAVENOUS | Status: AC | PRN
  Administered 2024-04-09: 95 mL via INTRAVENOUS

## 2024-04-10 ENCOUNTER — Other Ambulatory Visit: Payer: Self-pay

## 2024-04-10 DIAGNOSIS — R0609 Other forms of dyspnea: Secondary | ICD-10-CM

## 2024-04-10 DIAGNOSIS — I10 Essential (primary) hypertension: Secondary | ICD-10-CM

## 2024-04-12 ENCOUNTER — Telehealth: Payer: Self-pay

## 2024-04-12 NOTE — Telephone Encounter (Signed)
 Pt calling to get CT results, requesting cb

## 2024-04-14 ENCOUNTER — Ambulatory Visit (HOSPITAL_BASED_OUTPATIENT_CLINIC_OR_DEPARTMENT_OTHER)
Admission: RE | Admit: 2024-04-14 | Discharge: 2024-04-14 | Disposition: A | Discharge status: Home / Self Care | Source: Ambulatory Visit

## 2024-04-14 ENCOUNTER — Ambulatory Visit: Payer: Self-pay

## 2024-04-14 DIAGNOSIS — R931 Abnormal findings on diagnostic imaging of heart and coronary circulation: Secondary | ICD-10-CM | POA: Insufficient documentation

## 2024-04-14 DIAGNOSIS — I2510 Atherosclerotic heart disease of native coronary artery without angina pectoris: Secondary | ICD-10-CM | POA: Insufficient documentation

## 2024-04-14 HISTORY — DX: Atherosclerotic heart disease of native coronary artery without angina pectoris: I25.10

## 2024-04-14 NOTE — Addendum Note (Signed)
 Addended by: LIBORIO HAI REDDY on: 04/14/2024 06:58 PM   Modules accepted: Orders

## 2024-04-15 NOTE — Telephone Encounter (Signed)
 Results reviewed with pt as per Dr. Madireddy's note.  Pt verbalized understanding and had no additional questions. Routed to PCP.   Result Note 5 mm lung nodule on the right side and 4 mm lung nodule on the left side. Non-smoker.  Would be considered low risk. Please forward to patient's PCP regarding follow-up imaging if she has any other risk factors for malignancy. Please call and inform patient that there are lung nodules noted which are likely benign. I will discuss this with her at length at her follow-up visit CT CORONARY MORPH W/CTA COR W/SCORE W/CA W/CM &/OR WO/CM Madireddy, Alean SAUNDERS, MD    04/14/24  7:49 PM Result Note Reviewed the cardiac CT images and messaged patient CT CORONARY FRACTIONAL FLOW RESERVE FLUID ANALYSIS Madireddy, Alean SAUNDERS, MD to Arloa Olam BIRCH, RN    04/14/24  7:46 PM Result Note Abnormal cardiac CT, borderline CT FFR. Please schedule for follow-up visit with me.  Advise to avoid moderate to heavy exertion.   CT CORONARY MORPH W/CTA COR W/SCORE W/CA W/CM &/OR WO/CM Madireddy, Alean SAUNDERS, MD to Kennady Zimmerle     04/14/24  7:46 PM Dear Ms. Hor,   Your cardiac CT study shows severe amount of plaque buildup in the blood vessels.  One of the blood vessels has at least moderate degree of stenosis and flow across this lesion appears to be borderline.   I would like to review these results further in person at the office.  I will have the office schedule for the visit. Depending on your ongoing degree of symptoms and preference we can discuss further evaluation with stress test versus proceeding with a cardiac catheterization.    Please do not hesitate to contact my office with any questions.  Thank you

## 2024-04-23 ENCOUNTER — Ambulatory Visit

## 2024-04-23 ENCOUNTER — Ambulatory Visit: Payer: Self-pay

## 2024-04-23 ENCOUNTER — Ambulatory Visit (HOSPITAL_BASED_OUTPATIENT_CLINIC_OR_DEPARTMENT_OTHER)
Admission: RE | Admit: 2024-04-23 | Discharge: 2024-04-23 | Disposition: A | Discharge status: Home / Self Care | Source: Ambulatory Visit

## 2024-04-23 VITALS — BP 128/84 | HR 79 | Ht 64.6 in | Wt 200.1 lb

## 2024-04-23 DIAGNOSIS — I25119 Atherosclerotic heart disease of native coronary artery with unspecified angina pectoris: Secondary | ICD-10-CM | POA: Diagnosis not present

## 2024-04-23 DIAGNOSIS — I10 Essential (primary) hypertension: Secondary | ICD-10-CM

## 2024-04-23 DIAGNOSIS — E782 Mixed hyperlipidemia: Secondary | ICD-10-CM | POA: Diagnosis not present

## 2024-04-23 DIAGNOSIS — R931 Abnormal findings on diagnostic imaging of heart and coronary circulation: Secondary | ICD-10-CM | POA: Insufficient documentation

## 2024-04-23 DIAGNOSIS — R0609 Other forms of dyspnea: Secondary | ICD-10-CM | POA: Diagnosis not present

## 2024-04-23 HISTORY — DX: Abnormal findings on diagnostic imaging of heart and coronary circulation: R93.1

## 2024-04-23 LAB — ECHOCARDIOGRAM COMPLETE
AR max vel: 2.15 cm2
AV Area VTI: 2.16 cm2
AV Area mean vel: 2.1 cm2
AV Mean grad: 5 mmHg
AV Peak grad: 9 mmHg
Ao pk vel: 1.5 m/s
Area-P 1/2: 3.89 cm2
Calc EF: 67.1 %
Height: 64.6 in
S' Lateral: 1.5 cm
Single Plane A2C EF: 65.6 %
Single Plane A4C EF: 69.8 %
Weight: 3201.28 [oz_av]

## 2024-04-23 MED ORDER — ROSUVASTATIN CALCIUM 20 MG PO TABS: 20.0000 mg | ORAL_TABLET | Freq: Every day | ORAL | 3 refills | Status: DC

## 2024-04-23 NOTE — Assessment & Plan Note (Signed)
 Last lipid panel to review is from 07/26/2023 total.  250, HDL 62, LDL 159, triglycerides 162. Hemoglobin A1c 6.3 suggest prediabetes.  In this context intensify statin therapy and switch from atorvastatin to Crestor  20 mg once daily. Will obtain fasting lipid panel today

## 2024-04-23 NOTE — Progress Notes (Addendum)
 Cardiology Consultation:    Date:  04/23/2024   ID:  [name], DOB Nov 08, 1950, MRN [id number]  PCP:  [name]  Cardiologist:  [name] Deavin Forst, MD   Referring MD: [name], *   No chief complaint on file.    ASSESSMENT AND PLAN:   [name] 73 year old woman with history of coronary artery disease now identified on cardiac CT 04/09/2024 done in the setting of progressive symptoms of dyspnea on exertion. 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     Problem List Items Addressed This Visit     Hyperlipidemia   Last lipid panel to review is from 07/26/2023 total.  250, HDL 62, LDL 159, triglycerides 162. Hemoglobin A1c 6.3 suggest prediabetes.  In this context intensify statin therapy and switch from atorvastatin to Crestor  20 mg once daily. Will obtain fasting lipid panel today      Relevant Medications   rosuvastatin  (CRESTOR ) 20 MG tablet   Other Relevant Orders   Lipid Profile   CBC   Basic Metabolic Panel (BMET)   Hypertension   In group controlled on current regimen with amlodipine 5 mg once daily And triamterene -hydrochlorothiazide 37.5 mg - 25 mg half tablet a day. Target below 130/80 mmHg.       Relevant Medications   rosuvastatin  (CRESTOR ) 20 MG tablet   Other Relevant Orders   Lipid Profile   CBC   Basic Metabolic Panel (BMET)   CAD abnormal cardiac CT 04-09-2024, CAD RADS 3 study 50-69 percent LAD and D3 lesion, borderline lesion by CT-FFR - Primary   With ongoing symptoms of progressive dyspnea on exertion and abnormal cardiac CT imaging results, discussed further evaluation with cardiac catheterization.  Discussed the procedure at length.  Shared Decision Making/Informed Consent{ The risks [stroke (1 in 1000), death (1 in 1000), kidney  failure [usually temporary] (1 in 500), bleeding (1 in 200), allergic reaction [possibly serious] (1 in 200)], benefits (diagnostic support and management of coronary artery disease) and alternatives of a cardiac catheterization were discussed in detail with her and she is willing to proceed. Also discussed extensively about potential contrast related kidney failure usually temporary and most people and given her baseline kidney function her risk is minimal.  Tentatively to schedule for cardiac cath at [facility] Advised her to avoid any moderate to heavy exertional activities until workup is completed.  Continue with aspirin  81 mg once daily Continue with statin therapy, intensified regimen by switching to Crestor  20 mg once daily.        Relevant Medications   rosuvastatin  (CRESTOR ) 20 MG tablet   Other Relevant Orders   EKG 12-Lead (Completed)   Lipid Profile   CBC   Basic Metabolic Panel (BMET)   Abnormal findings diagnostic imaging of heart and coronary circulation   Relevant Orders   EKG 12-Lead (Completed)   Lipid Profile   CBC   Basic Metabolic Panel (BMET)   Return to clinic tentatively in 4 weeks  Addendum 04/23/2024: Peer to peer for cardiac cath call taken, approved, confirmation number [id number] (good till 06/07/2024) History of Present Illness:    [name] is a 73 y.o. female who is being seen today for follow-up visit. PCP is through [facility]. Last visit with me in the office was 03/19/2024.  Pleasant woman here for the visit by herself.  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  No prior history of CAD, CHF, MI, CVA.   With progressive symptoms of dyspnea on exertion relieving with rest proceeded with cardiac CT coronary angiogram 04/09/2024 which noted calcium  score  479, total plaque volume 476 mm cube, CAD RADS 3 study moderate stenosis proximal to distal LAD long lesion and proximal diagonal 3 moderate stenosis and assessment by CT FFR noted borderline lesion across the proximal to distal LAD lesion.  Extracardiac findings were notable for a 5 mm nodule in right lung in 4 mm nodule in left lung for which low risk was noted and no follow-up recommended if low risk.  Here for the visit today mentions she continues to have intermittent symptoms of shortness of breath with exertion.  There are days where she feels better. Continues to be compliant with her medications and feels her blood pressures have improved with consistent dosing of hydrochlorothiazide-triamterene .  Taking her current dose of atorvastatin consistently without any significant side effect.  Has not been on Ozempic due to cost coverage through insurance, to get for few months in the past with good improvement.  Now she is frustrated with lack of success losing weight  EKG in the clinic today shows sinus rhythm heart rate 79/min, PR interval 208 ms, QRS duration 84 ms, QTc 447 ms.  Past Medical History:  Diagnosis Date   Complication of breast implant 06/2013   Constipation    Dyspnea on exertion    Food allergy    GERD (gastroesophageal reflux disease)    GERD (gastroesophageal reflux disease)    High cholesterol    Hypertension    under control with med., has been on med. x 5 yr.   Joint pain    Osteoarthritis of both knees    PONV (postoperative nausea and vomiting)    Prediabetes 07/23/2019   Seasonal allergies    Sensitive skin     Past Surgical History:  Procedure Laterality Date   ABDOMINAL HYSTERECTOMY     partial - age 11   APPENDECTOMY     BREAST BIOPSY     x 2   BREAST CAPSULECTOMY WITH IMPLANT EXCHANGE Right 09/17/2007   with exc. accessory breast tissue right breast and axilla   BREAST CAPSULECTOMY WITH IMPLANT EXCHANGE Bilateral 07/22/2013   Procedure:  BILATERAL BREAST CAPSULECTOMY WITH IMPLANT EXCHANGE AND RIGHT RECONSTRUCTION/REVISION;  Surgeon: Elna Pick, MD;  Location: [facility];  Service: Plastics;  Laterality: Bilateral;   BREAST LUMPECTOMY Right 09/29/2021   Procedure: RIGHT BREAST LUMPECTOMY;  Surgeon: Vernetta Berg, MD;  Location: [facility];  Service: General;  Laterality: Right;   KNEE ARTHROSCOPY Right 09/12/2001   MASTECTOMY Bilateral    PLACEMENT OF BREAST IMPLANTS     after mastectomy   REMOVAL OF BILATERAL TISSUE EXPANDERS WITH PLACEMENT OF BILATERAL BREAST IMPLANTS     TONSILLECTOMY     age 8   TOTAL KNEE ARTHROPLASTY Right 10/25/2002   TOTAL KNEE ARTHROPLASTY Right     Current Medications: Current Meds  Medication Sig   amLODipine (NORVASC) 5 MG tablet Take 5 mg by mouth daily.   aspirin  EC 81 MG tablet Take 1 tablet (81 mg total) by mouth daily. Swallow whole.   Cholecalciferol (VITAMIN D3) 50 MCG (2000 UT) capsule Take 2,000 Units by mouth daily.   Cyanocobalamin 3000 MCG/ML LIQD Place 1 each under the tongue  daily.   diclofenac (VOLTAREN) 75 MG EC tablet Take 75 mg by mouth 2 (two) times daily. (Patient taking differently: Take 75 mg by mouth as needed for mild pain (pain score 1-3) or moderate pain (pain score 4-6).)   famotidine (PEPCID) 20 MG tablet Take 20 mg by mouth daily as needed for heartburn or indigestion.   loratadine (CLARITIN) 10 MG tablet Take 10 mg by mouth daily. (Patient taking differently: Take 10 mg by mouth as needed for allergies or rhinitis.)   rosuvastatin  (CRESTOR ) 20 MG tablet Take 1 tablet (20 mg total) by mouth daily.   traZODone (DESYREL) 50 MG tablet Take 50 mg by mouth at bedtime.   triamterene -hydrochlorothiazide (MAXZIDE-25) 37.5-25 MG tablet Take 0.5 tablets by mouth daily.   [DISCONTINUED] atorvastatin (LIPITOR) 20 MG tablet Take 20 mg by mouth daily.     Allergies:   Patient has no known allergies.   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  Tobacco Use   Smoking status: Never   Smokeless tobacco: Never  Substance and Sexual Activity   Alcohol use: No   Drug use: No   Sexual activity: Not on file  Other Topics Concern   Not on file  Social History Narrative   Not on file   Social Drivers of Health   Financial Resource Strain: Not on file  Food Insecurity: Not on file  Transportation Needs: Not on file  Physical Activity: Not on file  Stress: Not on file  Social Connections: Not on file     Family History: The patient's family history includes Anxiety disorder in her mother; Diabetes in her mother; Heart disease in her father; High blood pressure in her mother. ROS:   Please see the history of present illness.    All 14 point review of systems negative except as described per history of present illness.  EKGs/Labs/Other Studies Reviewed:    The following studies were reviewed today:   EKG:  EKG Interpretation Date/Time:  Tuesday April 23 2024 08:16:46 EDT Ventricular Rate:  79 PR Interval:  208 QRS Duration:  84 QT Interval:  390 QTC Calculation: 447 R Axis:   55  Text Interpretation: Normal sinus rhythm Possible Left atrial enlargement When compared with ECG of 19-Mar-2024 14:41, No significant change was found Confirmed by Brittney Hai reddy [phone]) on 04/23/2024 8:42:09 AM    Recent Labs: 03/19/2024: ALT 53; BUN 16; Creatinine, Ser 1.03; Hemoglobin 15.2; Platelets 262; Potassium 3.8; Sodium 141  Recent Lipid Panel    Component Value Date/Time   CHOL 261 (H) 07/22/2019 1037   TRIG 157 (H) 07/22/2019 1037   HDL 62 07/22/2019 1037   LDLCALC 171 (H) 07/22/2019 1037    Physical Exam:    VS:  BP 128/84   Pulse 79   Ht 5' 4.6 (1.641 m)   Wt 200 lb 1.3 oz (90.8 kg)    SpO2 98%   BMI 33.71 kg/m     Wt Readings from Last 3 Encounters:  04/23/24 200 lb 1.3 oz (90.8 kg)  03/19/24 200 lb 1.3 oz (90.8 kg)  09/29/21 185 lb 6.5 oz (84.1 kg)     GENERAL:  Well nourished, well developed in no acute distress NECK: No JVD;  No carotid bruits CARDIAC: RRR, S1 and S2 present, no murmurs, no rubs, no gallops Extremities: No pitting pedal edema. Pulses bilaterally symmetric with radial 2+ and dorsalis pedis 2+ NEUROLOGIC:  Alert and oriented x 3  Medication Adjustments/Labs and Tests Ordered: Current medicines are reviewed at length with the patient today.  Concerns regarding medicines are outlined above.  Orders Placed This Encounter  Procedures   Lipid Profile   CBC   Basic Metabolic Panel (BMET)   EKG 12-Lead   Meds ordered this encounter  Medications   rosuvastatin  (CRESTOR ) 20 MG tablet    Sig: Take 1 tablet (20 mg total) by mouth daily.    Dispense:  90 tablet    Refill:  3    Signed, Mireya Meditz reddy Mikail Goostree, MD, MPH, [facility]. 04/23/2024 9:13 AM    [facility]

## 2024-04-23 NOTE — Patient Instructions (Addendum)
 Medication Instructions:  Your physician has recommended you make the following change in your medication:   STOP: Atorvastatin START: Crestor  20 mg daily  *If you need a refill on your cardiac medications before your next appointment, please call your pharmacy*  Lab Work: Your physician recommends that you return for lab work in:   Labs today: Lipid, CBC, BMP  If you have labs (blood work) drawn today and your tests are completely normal, you will receive your results only by: MyChart Message (if you have MyChart) OR A paper copy in the mail If you have any lab test that is abnormal or we need to change your treatment, we will call you to review the results.  Testing/Procedures:  [place] A DEPT OF [place].  [facility] [place] [facility] [place] [address] [place], [place] [address] [place] [address] Dept: [phone] Loc: [phone]  Britny Riel  04/23/2024  You are scheduled for a Cardiac Catheterization on Monday, July 14 with Dr. Alm Clay.  1. Please arrive at the [facility] (Main Entrance A) at [facility]: [address] [place], [place] [address] at 5:30 AM (This time is 2 hour(s) before your procedure to ensure your preparation).   Free valet parking service is available. You will check in at ADMITTING. The support person will be asked to wait in the waiting room.  It is OK to have someone drop you off and come back when you are ready to be discharged.    Special note: Every effort is made to have your procedure done on time. Please understand that emergencies sometimes delay scheduled procedures.  2. Diet: Do not eat solid foods after midnight.  The patient may have clear liquids until 5am upon the day of the procedure.  3. Labs: You will need to have blood drawn on Tuesday, July 8 at [facility]: [address], [place]. You do not need to be fasting.  4. Medication instructions in  preparation for your procedure:  Hold Maxide the evening before your procedure.   Contrast Allergy: No  On the morning of your procedure, take your Aspirin  81 mg and any morning medicines NOT listed above.  You may use sips of water.  5. Plan to go home the same day, you will only stay overnight if medically necessary. 6. Bring a current list of your medications and current insurance cards. 7. You MUST have a responsible person to drive you home. 8. Someone MUST be with you the first 24 hours after you arrive home or your discharge will be delayed. 9. Please wear clothes that are easy to get on and off and wear slip-on shoes.  Thank you for allowing us  to care for you!   -- [facility]   Follow-Up: At [facility], you and your health needs are our priority.  As part of our continuing mission to provide you with exceptional heart care, our providers are all part of one team.  This team includes your primary Cardiologist (physician) and Advanced Practice Providers or APPs (Physician Assistants and Nurse Practitioners) who all work together to provide you with the care you need, when you need it.  Your next appointment:   1 month(s)  Provider:   Alean Kobus, MD    We recommend signing up for the patient portal called MyChart.  Sign up information is provided on this After Visit Summary.  MyChart is used to  connect with patients for Virtual Visits (Telemedicine).  Patients are able to view lab/test results, encounter notes, upcoming appointments, etc.  Non-urgent messages can be sent to your provider as well.   To learn more about what you can do with MyChart, go to [email or web address].   Other Instructions None

## 2024-04-23 NOTE — Assessment & Plan Note (Signed)
 With ongoing symptoms of progressive dyspnea on exertion and abnormal cardiac CT imaging results, discussed further evaluation with cardiac catheterization.  Discussed the procedure at length.  Shared Decision Making/Informed Consent{ The risks [stroke (1 in 1000), death (1 in 1000), kidney failure [usually temporary] (1 in 500), bleeding (1 in 200), allergic reaction [possibly serious] (1 in 200)], benefits (diagnostic support and management of coronary artery disease) and alternatives of a cardiac catheterization were discussed in detail with her and she is willing to proceed. Also discussed extensively about potential contrast related kidney failure usually temporary and most people and given her baseline kidney function her risk is minimal.  Tentatively to schedule for cardiac cath at [facility]. Advised her to avoid any moderate to heavy exertional activities until workup is completed.  Continue with aspirin  81 mg once daily Continue with statin therapy, intensified regimen by switching to Crestor  20 mg once daily.

## 2024-04-23 NOTE — Assessment & Plan Note (Signed)
 In group controlled on current regimen with amlodipine 5 mg once daily And triamterene -hydrochlorothiazide 37.5 mg - 25 mg half tablet a day. Target below 130/80 mmHg.

## 2024-04-23 NOTE — H&P (View-Only) (Signed)
 Cardiology Consultation:    Date:  04/23/2024   ID:  [name], DOB Nov 08, 1950, MRN [id number]  PCP:  [facility]  Cardiologist:  Alean SAUNDERS Deavin Forst, MD   Referring MD: Liborio Alean SAUNDERS, *   No chief complaint on file.    ASSESSMENT AND PLAN:   [name] 73 year old woman with history of coronary artery disease now identified on cardiac CT 04/09/2024 done in the setting of progressive symptoms of dyspnea on exertion. 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     Problem List Items Addressed This Visit     Hyperlipidemia   Last lipid panel to review is from 07/26/2023 total.  250, HDL 62, LDL 159, triglycerides 162. Hemoglobin A1c 6.3 suggest prediabetes.  In this context intensify statin therapy and switch from atorvastatin to Crestor  20 mg once daily. Will obtain fasting lipid panel today      Relevant Medications   rosuvastatin  (CRESTOR ) 20 MG tablet   Other Relevant Orders   Lipid Profile   CBC   Basic Metabolic Panel (BMET)   Hypertension   In group controlled on current regimen with amlodipine 5 mg once daily And triamterene -hydrochlorothiazide 37.5 mg - 25 mg half tablet a day. Target below 130/80 mmHg.       Relevant Medications   rosuvastatin  (CRESTOR ) 20 MG tablet   Other Relevant Orders   Lipid Profile   CBC   Basic Metabolic Panel (BMET)   CAD abnormal cardiac CT 04-09-2024, CAD RADS 3 study 50-69 percent LAD and D3 lesion, borderline lesion by CT-FFR - Primary   With ongoing symptoms of progressive dyspnea on exertion and abnormal cardiac CT imaging results, discussed further evaluation with cardiac catheterization.  Discussed the procedure at length.  Shared Decision Making/Informed Consent{ The risks [stroke (1 in 1000), death (1 in 1000), kidney  failure [usually temporary] (1 in 500), bleeding (1 in 200), allergic reaction [possibly serious] (1 in 200)], benefits (diagnostic support and management of coronary artery disease) and alternatives of a cardiac catheterization were discussed in detail with her and she is willing to proceed. Also discussed extensively about potential contrast related kidney failure usually temporary and most people and given her baseline kidney function her risk is minimal.  Tentatively to schedule for cardiac cath at [facility] Advised her to avoid any moderate to heavy exertional activities until workup is completed.  Continue with aspirin  81 mg once daily Continue with statin therapy, intensified regimen by switching to Crestor  20 mg once daily.        Relevant Medications   rosuvastatin  (CRESTOR ) 20 MG tablet   Other Relevant Orders   EKG 12-Lead (Completed)   Lipid Profile   CBC   Basic Metabolic Panel (BMET)   Abnormal findings diagnostic imaging of heart and coronary circulation   Relevant Orders   EKG 12-Lead (Completed)   Lipid Profile   CBC   Basic Metabolic Panel (BMET)   Return to clinic tentatively in 4 weeks  Addendum 04/23/2024: Peer to peer for cardiac cath call taken, approved, confirmation number [id number] (good till 06/07/2024) History of Present Illness:    [name] is a 73 y.o. female who is being seen today for follow-up visit. PCP is through [facility]. Last visit with me in the office was 03/19/2024.  Pleasant woman here for the visit by herself.  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  No prior history of CAD, CHF, MI, CVA.   With progressive symptoms of dyspnea on exertion relieving with rest proceeded with cardiac CT coronary angiogram 04/09/2024 which noted calcium  score  479, total plaque volume 476 mm cube, CAD RADS 3 study moderate stenosis proximal to distal LAD long lesion and proximal diagonal 3 moderate stenosis and assessment by CT FFR noted borderline lesion across the proximal to distal LAD lesion.  Extracardiac findings were notable for a 5 mm nodule in right lung in 4 mm nodule in left lung for which low risk was noted and no follow-up recommended if low risk.  Here for the visit today mentions she continues to have intermittent symptoms of shortness of breath with exertion.  There are days where she feels better. Continues to be compliant with her medications and feels her blood pressures have improved with consistent dosing of hydrochlorothiazide-triamterene .  Taking her current dose of atorvastatin consistently without any significant side effect.  Has not been on Ozempic due to cost coverage through insurance, to get for few months in the past with good improvement.  Now she is frustrated with lack of success losing weight  EKG in the clinic today shows sinus rhythm heart rate 79/min, PR interval 208 ms, QRS duration 84 ms, QTc 447 ms.  Past Medical History:  Diagnosis Date   Complication of breast implant 06/2013   Constipation    Dyspnea on exertion    Food allergy    GERD (gastroesophageal reflux disease)    GERD (gastroesophageal reflux disease)    High cholesterol    Hypertension    under control with med., has been on med. x 5 yr.   Joint pain    Osteoarthritis of both knees    PONV (postoperative nausea and vomiting)    Prediabetes 07/23/2019   Seasonal allergies    Sensitive skin     Past Surgical History:  Procedure Laterality Date   ABDOMINAL HYSTERECTOMY     partial - age 11   APPENDECTOMY     BREAST BIOPSY     x 2   BREAST CAPSULECTOMY WITH IMPLANT EXCHANGE Right 09/17/2007   with exc. accessory breast tissue right breast and axilla   BREAST CAPSULECTOMY WITH IMPLANT EXCHANGE Bilateral 07/22/2013   Procedure:  BILATERAL BREAST CAPSULECTOMY WITH IMPLANT EXCHANGE AND RIGHT RECONSTRUCTION/REVISION;  Surgeon: Elna Pick, MD;  Location: [facility];  Service: Plastics;  Laterality: Bilateral;   BREAST LUMPECTOMY Right 09/29/2021   Procedure: RIGHT BREAST LUMPECTOMY;  Surgeon: Vernetta Berg, MD;  Location: [facility];  Service: General;  Laterality: Right;   KNEE ARTHROSCOPY Right 09/12/2001   MASTECTOMY Bilateral    PLACEMENT OF BREAST IMPLANTS     after mastectomy   REMOVAL OF BILATERAL TISSUE EXPANDERS WITH PLACEMENT OF BILATERAL BREAST IMPLANTS     TONSILLECTOMY     age 8   TOTAL KNEE ARTHROPLASTY Right 10/25/2002   TOTAL KNEE ARTHROPLASTY Right     Current Medications: Current Meds  Medication Sig   amLODipine (NORVASC) 5 MG tablet Take 5 mg by mouth daily.   aspirin  EC 81 MG tablet Take 1 tablet (81 mg total) by mouth daily. Swallow whole.   Cholecalciferol (VITAMIN D3) 50 MCG (2000 UT) capsule Take 2,000 Units by mouth daily.   Cyanocobalamin 3000 MCG/ML LIQD Place 1 each under the tongue  daily.   diclofenac (VOLTAREN) 75 MG EC tablet Take 75 mg by mouth 2 (two) times daily. (Patient taking differently: Take 75 mg by mouth as needed for mild pain (pain score 1-3) or moderate pain (pain score 4-6).)   famotidine (PEPCID) 20 MG tablet Take 20 mg by mouth daily as needed for heartburn or indigestion.   loratadine (CLARITIN) 10 MG tablet Take 10 mg by mouth daily. (Patient taking differently: Take 10 mg by mouth as needed for allergies or rhinitis.)   rosuvastatin  (CRESTOR ) 20 MG tablet Take 1 tablet (20 mg total) by mouth daily.   traZODone (DESYREL) 50 MG tablet Take 50 mg by mouth at bedtime.   triamterene -hydrochlorothiazide (MAXZIDE-25) 37.5-25 MG tablet Take 0.5 tablets by mouth daily.   [DISCONTINUED] atorvastatin (LIPITOR) 20 MG tablet Take 20 mg by mouth daily.     Allergies:   Patient has no known allergies.   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  Tobacco Use   Smoking status: Never   Smokeless tobacco: Never  Substance and Sexual Activity   Alcohol use: No   Drug use: No   Sexual activity: Not on file  Other Topics Concern   Not on file  Social History Narrative   Not on file   Social Drivers of Health   Financial Resource Strain: Not on file  Food Insecurity: Not on file  Transportation Needs: Not on file  Physical Activity: Not on file  Stress: Not on file  Social Connections: Not on file     Family History: The patient's family history includes Anxiety disorder in her mother; Diabetes in her mother; Heart disease in her father; High blood pressure in her mother. ROS:   Please see the history of present illness.    All 14 point review of systems negative except as described per history of present illness.  EKGs/Labs/Other Studies Reviewed:    The following studies were reviewed today:   EKG:  EKG Interpretation Date/Time:  Tuesday April 23 2024 08:16:46 EDT Ventricular Rate:  79 PR Interval:  208 QRS Duration:  84 QT Interval:  390 QTC Calculation: 447 R Axis:   55  Text Interpretation: Normal sinus rhythm Possible Left atrial enlargement When compared with ECG of 19-Mar-2024 14:41, No significant change was found Confirmed by Liborio Hai reddy [phone]) on 04/23/2024 8:42:09 AM    Recent Labs: 03/19/2024: ALT 53; BUN 16; Creatinine, Ser 1.03; Hemoglobin 15.2; Platelets 262; Potassium 3.8; Sodium 141  Recent Lipid Panel    Component Value Date/Time   CHOL 261 (H) 07/22/2019 1037   TRIG 157 (H) 07/22/2019 1037   HDL 62 07/22/2019 1037   LDLCALC 171 (H) 07/22/2019 1037    Physical Exam:    VS:  BP 128/84   Pulse 79   Ht 5' 4.6 (1.641 m)   Wt 200 lb 1.3 oz (90.8 kg)    SpO2 98%   BMI 33.71 kg/m     Wt Readings from Last 3 Encounters:  04/23/24 200 lb 1.3 oz (90.8 kg)  03/19/24 200 lb 1.3 oz (90.8 kg)  09/29/21 185 lb 6.5 oz (84.1 kg)     GENERAL:  Well nourished, well developed in no acute distress NECK: No JVD;  No carotid bruits CARDIAC: RRR, S1 and S2 present, no murmurs, no rubs, no gallops Extremities: No pitting pedal edema. Pulses bilaterally symmetric with radial 2+ and dorsalis pedis 2+ NEUROLOGIC:  Alert and oriented x 3  Medication Adjustments/Labs and Tests Ordered: Current medicines are reviewed at length with the patient today.  Concerns regarding medicines are outlined above.  Orders Placed This Encounter  Procedures   Lipid Profile   CBC   Basic Metabolic Panel (BMET)   EKG 12-Lead   Meds ordered this encounter  Medications   rosuvastatin  (CRESTOR ) 20 MG tablet    Sig: Take 1 tablet (20 mg total) by mouth daily.    Dispense:  90 tablet    Refill:  3    Signed, Mireya Meditz reddy Mikail Goostree, MD, MPH, [facility]. 04/23/2024 9:13 AM    [facility]

## 2024-04-24 ENCOUNTER — Ambulatory Visit: Payer: Self-pay

## 2024-04-24 LAB — LIPID PANEL
Chol/HDL Ratio: 2.6 ratio (ref 0.0–4.4)
Cholesterol, Total: 181 mg/dL (ref 100–199)
HDL: 69 mg/dL (ref >39)
LDL Chol Calc (NIH): 92 mg/dL (ref 0–99)
Triglycerides: 116 mg/dL (ref 0–149)
VLDL Cholesterol Cal: 20 mg/dL (ref 5–40)

## 2024-04-24 LAB — BASIC METABOLIC PANEL WITH GFR
BUN/Creatinine Ratio: 18 (ref 12–28)
BUN: 15 mg/dL (ref 8–27)
CO2: 19 mmol/L — ABNORMAL LOW (ref 20–29)
Calcium: 9.8 mg/dL (ref 8.7–10.3)
Chloride: 102 mmol/L (ref 96–106)
Creatinine, Ser: 0.82 mg/dL (ref 0.57–1.00)
Glucose: 110 mg/dL — ABNORMAL HIGH (ref 70–99)
Potassium: 3.8 mmol/L (ref 3.5–5.2)
Sodium: 138 mmol/L (ref 134–144)
eGFR: 75 mL/min/1.73 (ref >59)

## 2024-04-24 LAB — CBC
Hematocrit: 45.4 % (ref 34.0–46.6)
Hemoglobin: 14.3 g/dL (ref 11.1–15.9)
MCH: 26.5 pg — ABNORMAL LOW (ref 26.6–33.0)
MCHC: 31.5 g/dL (ref 31.5–35.7)
MCV: 84 fL (ref 79–97)
Platelets: 258 x10E3/uL (ref 150–450)
RBC: 5.39 x10E6/uL — ABNORMAL HIGH (ref 3.77–5.28)
RDW: 14.4 % (ref 11.7–15.4)
WBC: 8.9 x10E3/uL (ref 3.4–10.8)

## 2024-04-25 ENCOUNTER — Telehealth: Payer: Self-pay | Admitting: *Deleted

## 2024-04-25 NOTE — Telephone Encounter (Addendum)
 Cardiac Catheterization scheduled at [facility] for: Monday April 29, 2024 7:30 AM Arrival time [facility] Main Entrance A at: 5:30 AM  Nothing to eat after midnight prior to procedure, clear liquids until 5 AM day of procedure.  Medication instructions: -Hold:  Triamterene -HCT-AM of procedure -Other Usual morning medications can be taken with sips of water including aspirin  81 mg.  Plan to go home the same day, you will only stay overnight if medically necessary.  You must have responsible adult to drive you home.  Someone must be with you the first 24 hours after you arrive home.  Reviewed procedure instructions with patient.

## 2024-04-29 ENCOUNTER — Other Ambulatory Visit: Payer: Self-pay

## 2024-04-29 ENCOUNTER — Encounter (HOSPITAL_COMMUNITY)
Admission: RE | Disposition: A | Discharge status: Home / Self Care | Payer: Self-pay | Source: Ambulatory Visit | Attending: Cardiology

## 2024-04-29 ENCOUNTER — Ambulatory Visit (HOSPITAL_COMMUNITY)
Admission: RE | Admit: 2024-04-29 | Discharge: 2024-04-29 | Disposition: A | Discharge status: Home / Self Care | Source: Ambulatory Visit | Attending: Cardiology | Admitting: Cardiology

## 2024-04-29 DIAGNOSIS — Z90710 Acquired absence of both cervix and uterus: Secondary | ICD-10-CM | POA: Diagnosis not present

## 2024-04-29 DIAGNOSIS — R931 Abnormal findings on diagnostic imaging of heart and coronary circulation: Secondary | ICD-10-CM | POA: Diagnosis not present

## 2024-04-29 DIAGNOSIS — E785 Hyperlipidemia, unspecified: Secondary | ICD-10-CM | POA: Insufficient documentation

## 2024-04-29 DIAGNOSIS — I2510 Atherosclerotic heart disease of native coronary artery without angina pectoris: Secondary | ICD-10-CM | POA: Diagnosis not present

## 2024-04-29 DIAGNOSIS — I25119 Atherosclerotic heart disease of native coronary artery with unspecified angina pectoris: Secondary | ICD-10-CM

## 2024-04-29 DIAGNOSIS — R0609 Other forms of dyspnea: Secondary | ICD-10-CM | POA: Diagnosis not present

## 2024-04-29 DIAGNOSIS — I10 Essential (primary) hypertension: Secondary | ICD-10-CM | POA: Insufficient documentation

## 2024-04-29 DIAGNOSIS — Z79899 Other long term (current) drug therapy: Secondary | ICD-10-CM | POA: Diagnosis not present

## 2024-04-29 HISTORY — PX: LEFT HEART CATH AND CORONARY ANGIOGRAPHY: CATH118249

## 2024-04-29 LAB — GLUCOSE, CAPILLARY: Glucose-Capillary: 144 mg/dL — ABNORMAL HIGH (ref 70–99)

## 2024-04-29 SURGERY — LEFT HEART CATH AND CORONARY ANGIOGRAPHY
Anesthesia: LOCAL

## 2024-04-29 MED ORDER — FENTANYL CITRATE (PF) 100 MCG/2ML IJ SOLN
INTRAMUSCULAR | Status: DC | PRN
  Administered 2024-04-29: 25 ug via INTRAVENOUS

## 2024-04-29 MED ORDER — LIDOCAINE HCL (PF) 1 % IJ SOLN
INTRAMUSCULAR | Status: AC
  Filled 2024-04-29: qty 30

## 2024-04-29 MED ORDER — ONDANSETRON HCL 4 MG/2ML IJ SOLN: 4.0000 mg | Freq: Four times a day (QID) | INTRAMUSCULAR | Status: DC | PRN

## 2024-04-29 MED ORDER — SODIUM CHLORIDE 0.9 % IV SOLN: INTRAVENOUS | Status: DC

## 2024-04-29 MED ORDER — SODIUM CHLORIDE 0.9 % IV SOLN: 250.0000 mL | INTRAVENOUS | Status: DC | PRN

## 2024-04-29 MED ORDER — MIDAZOLAM HCL 2 MG/2ML IJ SOLN
INTRAMUSCULAR | Status: DC | PRN
Start: 2024-04-29 — End: 2024-04-29
  Administered 2024-04-29: 1 mg via INTRAVENOUS

## 2024-04-29 MED ORDER — HEPARIN SODIUM (PORCINE) 1000 UNIT/ML IJ SOLN
INTRAMUSCULAR | Status: AC
  Filled 2024-04-29: qty 10

## 2024-04-29 MED ORDER — ACETAMINOPHEN 325 MG PO TABS: 650.0000 mg | ORAL_TABLET | ORAL | Status: DC | PRN

## 2024-04-29 MED ORDER — FENTANYL CITRATE (PF) 100 MCG/2ML IJ SOLN
INTRAMUSCULAR | Status: AC
  Filled 2024-04-29: qty 2

## 2024-04-29 MED ORDER — VERAPAMIL HCL 2.5 MG/ML IV SOLN
INTRAVENOUS | Status: AC
  Filled 2024-04-29: qty 2

## 2024-04-29 MED ORDER — ASPIRIN 81 MG PO CHEW: 81.0000 mg | CHEWABLE_TABLET | ORAL | Status: DC

## 2024-04-29 MED ORDER — LIDOCAINE HCL (PF) 1 % IJ SOLN
INTRAMUSCULAR | Status: DC | PRN
Start: 2024-04-29 — End: 2024-04-29
  Administered 2024-04-29: 2 mL

## 2024-04-29 MED ORDER — IOHEXOL 350 MG/ML SOLN
INTRAVENOUS | Status: DC | PRN
  Administered 2024-04-29: 65 mL

## 2024-04-29 MED ORDER — VERAPAMIL HCL 2.5 MG/ML IV SOLN
INTRAVENOUS | Status: DC | PRN
  Administered 2024-04-29: 10 mL via INTRA_ARTERIAL

## 2024-04-29 MED ORDER — MIDAZOLAM HCL 2 MG/2ML IJ SOLN
INTRAMUSCULAR | Status: AC
  Filled 2024-04-29: qty 2

## 2024-04-29 MED ORDER — SODIUM CHLORIDE 0.9% FLUSH: 3.0000 mL | Freq: Two times a day (BID) | INTRAVENOUS | Status: DC

## 2024-04-29 MED ORDER — SODIUM CHLORIDE 0.9 % WEIGHT BASED INFUSION: 1.0000 mL/kg/h | INTRAVENOUS | Status: DC

## 2024-04-29 MED ORDER — HEPARIN SODIUM (PORCINE) 1000 UNIT/ML IJ SOLN
INTRAMUSCULAR | Status: DC | PRN
Start: 2024-04-29 — End: 2024-04-29
  Administered 2024-04-29: 4000 [IU] via INTRAVENOUS

## 2024-04-29 MED ORDER — HEPARIN (PORCINE) IN NACL 1000-0.9 UT/500ML-% IV SOLN
INTRAVENOUS | Status: DC | PRN
  Administered 2024-04-29: 1000 mL

## 2024-04-29 MED ORDER — SODIUM CHLORIDE 0.9 % WEIGHT BASED INFUSION: 3.0000 mL/kg/h | INTRAVENOUS | Status: AC

## 2024-04-29 MED ORDER — HYDRALAZINE HCL 20 MG/ML IJ SOLN: 10.0000 mg | INTRAMUSCULAR | Status: DC | PRN

## 2024-04-29 MED ORDER — SODIUM CHLORIDE 0.9% FLUSH
3.0000 mL | INTRAVENOUS | Status: DC | PRN
Start: 2024-04-29 — End: 2024-04-29

## 2024-04-29 MED ORDER — LABETALOL HCL 5 MG/ML IV SOLN: 10.0000 mg | INTRAVENOUS | Status: DC | PRN

## 2024-04-29 SURGICAL SUPPLY — 9 items
CATH INFINITI AMBI 5FR TG (CATHETERS) IMPLANT
CATH INFINITI JR4 5F (CATHETERS) IMPLANT
DEVICE RAD TR BAND REGULAR (VASCULAR PRODUCTS) IMPLANT
GLIDESHEATH SLEND SS 6F .021 (SHEATH) IMPLANT
GUIDEWIRE INQWIRE 1.5J.035X260 (WIRE) IMPLANT
KIT SYRINGE INJ CVI SPIKEX1 (MISCELLANEOUS) IMPLANT
PACK CARDIAC CATHETERIZATION (CUSTOM PROCEDURE TRAY) ×1 IMPLANT
SET ATX-X65L (MISCELLANEOUS) IMPLANT
SHEATH PROBE COVER 6X72 (BAG) IMPLANT

## 2024-04-29 NOTE — Interval H&P Note (Signed)
 History and Physical Interval Note:  04/29/2024 7:22 AM  [name]  has presented today for surgery, with the diagnosis of cad.  The various methods of treatment have been discussed with the patient and family. After consideration of risks, benefits and other options for treatment, the patient has consented to  Procedure(s): LEFT HEART CATH AND CORONARY ANGIOGRAPHY (N/A)  PERCUTANEOUS CORONARY INTERVENTION  as a surgical intervention.  The patient's history has been reviewed, patient examined, no change in status, stable for surgery.  I have reviewed the patient's chart and labs.  Questions were answered to the patient's satisfaction.    Cath Lab Visit (complete for each Cath Lab visit)  Clinical Evaluation Leading to the Procedure:   ACS: No.  Non-ACS:    Anginal Classification: CCS III  Anti-ischemic medical therapy: Minimal Therapy (1 class of medications)  Non-Invasive Test Results: Equivocal test results  Prior CABG: No previous CABG    Alm Clay

## 2024-04-29 NOTE — Discharge Instructions (Signed)
 Radial Site Care The following information offers guidance on how to care for yourself after your procedure. Your health care provider may also give you more specific instructions. If you have problems or questions, contact your health care provider. What can I expect after the procedure? After the procedure, it is common to have bruising and tenderness in the incision area. Follow these instructions at home: Incision site care  Follow instructions from your health care provider about how to take care of your incision site. Make sure you: Wash your hands with soap and water for at least 20 seconds before and after you change your bandage (dressing). If soap and water are not available, use hand sanitizer. Remove your dressing in 24 hours. Leave stitches (sutures), skin glue, or adhesive strips in place. These skin closures may need to stay in place for 2 weeks or longer. If adhesive strip edges start to loosen and curl up, you may trim the loose edges. Do not remove adhesive strips completely unless your health care provider tells you to do that. Do not take baths, swim, or use a hot tub for at least 1 week. You may shower 24 hours after the procedure or as told by your health care provider. Remove the dressing and gently wash the incision area with plain soap and water. Pat the area dry with a clean towel. Do not rub the site. That could cause bleeding. Do not apply powder or lotion to the site. Check your incision site every day for signs of infection. Check for: Redness, swelling, or pain. Fluid or blood. Warmth. Pus or a bad smell. Activity For 24 hours after the procedure, or as directed by your health care provider: Do not flex or bend the affected arm. Do not push or pull heavy objects with the affected arm. Do not operate machinery or power tools. Do not drive. You should not drive yourself home from the hospital or clinic if you go home during that time period. You may drive 24  hours after the procedure unless your health care provider tells you not to. Do not lift anything that is heavier than 10 lb (4.5 kg), or the limit that you are told, until your health care provider says that it is safe. Return to your normal activities as told by your health care provider. Ask your health care provider what activities are safe for you and when you can return to work. If you were given a sedative during the procedure, it can affect you for several hours. Do not drive or operate machinery until your health care provider says that it is safe. General instructions Take over-the-counter and prescription medicines only as told by your health care provider. If you will be going home right after the procedure, plan to have a responsible adult care for you for the time you are told. This is important. Keep all follow-up visits. This is important. Contact a health care provider if: You have a fever or chills. You have any of these signs of infection at your incision site: Redness, swelling, or pain. Fluid or blood. Warmth. Pus or a bad smell. Get help right away if: The incision area swells very fast. The incision area is bleeding, and the bleeding does not stop when you hold steady pressure on the area. Your arm or hand becomes pale, cool, tingly, or numb. These symptoms may represent a serious problem that is an emergency. Do not wait to see if the symptoms will go away. Get medical  help right away. Call your local emergency services (911 in the [place] Do not drive yourself to the hospital. Summary After the procedure, it is common to have bruising and tenderness at the incision site. Follow instructions from your health care provider about how to take care of your radial site incision. Check the incision every day for signs of infection. Do not lift anything that is heavier than 10 lb (4.5 kg), or the limit that you are told, until your health care provider says that it is  safe. Get help right away if the incision area swells very fast, you have bleeding at the incision site that will not stop, or your arm or hand becomes pale, cool, or numb. This information is not intended to replace advice given to you by your health care provider. Make sure you discuss any questions you have with your health care provider. Document Revised: 11/22/2020 Document Reviewed: 11/22/2020 Elsevier Patient Education  2024 [employer].

## 2024-04-30 ENCOUNTER — Encounter (HOSPITAL_COMMUNITY): Payer: Self-pay | Admitting: Cardiology

## 2024-06-03 ENCOUNTER — Other Ambulatory Visit: Payer: Self-pay

## 2024-06-03 DIAGNOSIS — E7800 Pure hypercholesterolemia, unspecified: Secondary | ICD-10-CM | POA: Insufficient documentation

## 2024-06-04 ENCOUNTER — Ambulatory Visit

## 2024-06-04 VITALS — BP 128/80 | HR 87 | Ht 64.5 in | Wt 198.0 lb

## 2024-06-04 DIAGNOSIS — I10 Essential (primary) hypertension: Secondary | ICD-10-CM | POA: Diagnosis not present

## 2024-06-04 DIAGNOSIS — I25119 Atherosclerotic heart disease of native coronary artery with unspecified angina pectoris: Secondary | ICD-10-CM

## 2024-06-04 DIAGNOSIS — E66811 Obesity, class 1: Secondary | ICD-10-CM | POA: Diagnosis not present

## 2024-06-04 DIAGNOSIS — E782 Mixed hyperlipidemia: Secondary | ICD-10-CM

## 2024-06-04 MED ORDER — ROSUVASTATIN CALCIUM 40 MG PO TABS: 40.0000 mg | ORAL_TABLET | Freq: Every day | ORAL | 3 refills | Status: AC

## 2024-06-04 NOTE — Patient Instructions (Signed)
 Medication Instructions:  INCREASE YOUR CRESTOR  TO 40 MG DAILY. CONTINUE ALL OTHER MEDICATION THERAPY.  Lab Work: CMET TO BE DONE IN 4-6 WEEKS.   Testing/Procedures: NONE  Follow-Up: At [facility], you and your health needs are our priority.  As part of our continuing mission to provide you with exceptional heart care, our providers are all part of one team.  This team includes your primary Cardiologist (physician) and Advanced Practice Providers or APPs (Physician Assistants and Nurse Practitioners) who all work together to provide you with the care you need, when you need it.  Your next appointment:   6 MONTHS  Provider:   Alean Kobus, MD

## 2024-06-04 NOTE — Assessment & Plan Note (Signed)
 Last lipid panel reviewed from 04/23/2024 LDL 92, HDL 69, total Vaslow 181, triglycerides 116. Target LDL below 70 mg/dL ideally below 55 mg/dL. Titrate up rosuvastatin  dose to 40 mg once daily. She is currently tolerating the dose well. Will repeat CMP in 4 to 6 weeks to follow-up on electrolytes and liver functions.  Will consider repeat lipid panel at next follow-up visit tentatively in 6 months.

## 2024-06-04 NOTE — Assessment & Plan Note (Signed)
 Recommend continue with dietary modifications and increase activity to target weight loss.  She did get benefit with Ozempic in terms of weight loss previously but had to be interrupted due to noncoverage through insurance. Given her CAD risk factors and moderate nonobstructive CAD as evidenced on cardiac CT imaging and cardiac cath, would recommend considering Ozempic again for weight loss. Will defer this to PCP.

## 2024-06-04 NOTE — Progress Notes (Signed)
 Cardiology Consultation:    Date:  06/04/2024   ID:  [name], DOB 06-24-1951, MRN [id number]  PCP:  [facility]  Cardiologist:  Alean SAUNDERS Early Steel, MD   Referring MD: [facility]*   Chief Complaint  Patient presents with   Follow-up    1 month post cath.     ASSESSMENT AND PLAN:   [name] 73 year old woman with history of CAD identified on cardiac CT 04/09/2024 [calcium  score 479, total plaque volume 476 mm cube, CAD RADS 3 study borderline CT FFR lesion across proximal to distal LAD] done in the setting of progressive symptoms of dyspnea on exertion followed up with cardiac cath 04/29/2024 noted mild nonobstructive coronary artery disease and normal LVEDP. 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  Small 5 mm right lung nodule and 4 mm left lung nodule noted on cardiac CT 04/09/2024 with low risk findings recommended no further follow-up.  Obesity [had good improvement with Ozempic but had to discontinue as not covered through the insurance].   Seen today for follow-up after cardiac cath.  Problem List Items Addressed This Visit     Hyperlipidemia   Last lipid panel reviewed from 04/23/2024 LDL 92, HDL 69, total Vaslow 181, triglycerides 116. Target LDL below 70 mg/dL ideally below 55 mg/dL. Titrate up rosuvastatin  dose to 40 mg once daily. She is currently tolerating the dose well. Will repeat CMP in 4 to 6 weeks to follow-up on electrolytes and liver functions.  Will consider repeat lipid panel at next follow-up visit tentatively in 6 months.       Relevant Medications   rosuvastatin  (CRESTOR ) 40 MG tablet   Hypertension   Well-controlled on current regimen with amlodipine 5 mg once daily Hydrochlorothiazide-triamterene  25 mg - 37.5 mg half a tablet once daily. Target  blood pressure below 130/80 mmHg.       Relevant Medications   rosuvastatin  (CRESTOR ) 40 MG tablet   CAD abnormal cardiac CT 04-09-2024, CAD RADS 3 study 50-69 percent LAD and D3 lesion, borderline lesion by CT-FFR; cardiac 04/29/2024 nonobstructive - Primary   Progressive dyspnea on exertion in the setting of nonobstructive moderate coronary atherosclerosis likely related to deconditioning.  - Continue aspirin  81 mg once daily Continue aggressive lipid-lowering therapy. Titrate up rosuvastatin  to 40 mg once daily.  Educated her about gradually increasing her activity/exercise to help with her overall effort tolerance.       Relevant Medications   rosuvastatin  (CRESTOR ) 40 MG tablet   Other Relevant Orders   Comprehensive metabolic panel with GFR   Obesity (BMI 30.0-34.9)   Recommend continue with dietary modifications and increase activity to target weight loss.  She did get benefit with Ozempic in terms of weight loss previously but had to be interrupted due to noncoverage through insurance. Given her CAD risk factors and moderate nonobstructive CAD as evidenced on cardiac CT imaging and cardiac cath, would recommend considering Ozempic again for weight loss. Will defer this to PCP.       Return to clinic for follow-up tentatively in 6 months.   History of Present Illness:    [name] is a 73 y.o. female who is being seen today for follow-up visit. PCP is through [facility]. Last visit with me in the office was 04/23/2024.  Pleasant woman here for the visit by herself for follow-up  after recent cardiac cath.  Coronary artery disease identified on cardiac CT 04/09/2024 [calcium  score 479, total plaque volume 476 mm cube, CAD RADS 3 study borderline CT FFR lesion across proximal to distal LAD] done in the setting of progressive symptoms of dyspnea on exertion followed up with cardiac cath 04/29/2024 noted mild nonobstructive coronary artery disease and normal  LVEDP. 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  Small 5 mm right lung nodule and 4 mm left lung nodule noted on cardiac CT 04/09/2024 with low risk findings recommended no further follow-up.  Obesity [had good improvement with Ozempic but had to discontinue as not covered through the insurance].  Mentions overall she is reassured with the cardiac cath findings showing no major blockage requiring intervention. She continues to feel short of breath with exertion that is relieved with rest. No symptoms at rest. Good compliance with medications. Tolerating her medications well.  She requests possibility of resuming Ozempic for weight loss.  lipid panel from 04/23/2024 LDL 92, HDL 69, total cholesterol 818 and triglycerides 116.   Past Medical History:  Diagnosis Date   Abnormal findings diagnostic imaging of heart and coronary circulation 04/23/2024   CAD abnormal cardiac CT 04-09-2024, CAD RADS 3 study 50-69 percent LAD and D3 lesion, borderline lesion by CT-FFR 04/14/2024   Complication of breast implant 06/2013   Constipation    Dyspnea on exertion    Food allergy    GERD (gastroesophageal reflux disease)    GERD (gastroesophageal reflux disease)    High cholesterol    Hyperlipidemia    Hypertension    under control with med., has been on med. x 5 yr.   Joint pain    Osteoarthritis of both knees    PONV (postoperative nausea and vomiting)    Prediabetes 07/23/2019   Seasonal allergies    Sensitive skin     Past Surgical History:  Procedure Laterality Date   ABDOMINAL HYSTERECTOMY     partial - age 48   APPENDECTOMY     BREAST BIOPSY     x 2   BREAST CAPSULECTOMY WITH IMPLANT EXCHANGE Right 09/17/2007   with exc. accessory breast tissue right breast and axilla   BREAST CAPSULECTOMY WITH IMPLANT EXCHANGE Bilateral  07/22/2013   Procedure: BILATERAL BREAST CAPSULECTOMY WITH IMPLANT EXCHANGE AND RIGHT RECONSTRUCTION/REVISION;  Surgeon: Elna Pick, MD;  Location: [facility];  Service: Plastics;  Laterality: Bilateral;   BREAST LUMPECTOMY Right 09/29/2021   Procedure: RIGHT BREAST LUMPECTOMY;  Surgeon: Vernetta Berg, MD;  Location: [facility];  Service: General;  Laterality: Right;   KNEE ARTHROSCOPY Right 09/12/2001   LEFT HEART CATH AND CORONARY ANGIOGRAPHY N/A 04/29/2024   Procedure: LEFT HEART CATH AND CORONARY ANGIOGRAPHY;  Surgeon: Anner Alm ORN, MD;  Location: [facility];  Service: Cardiovascular;  Laterality: N/A;   MASTECTOMY Bilateral    PLACEMENT OF BREAST IMPLANTS     after mastectomy   REMOVAL OF BILATERAL TISSUE EXPANDERS WITH PLACEMENT OF BILATERAL BREAST IMPLANTS     TONSILLECTOMY     age 15   TOTAL KNEE ARTHROPLASTY Right 10/25/2002   TOTAL KNEE ARTHROPLASTY Right     Current Medications: Current Meds  Medication Sig   amLODipine (NORVASC) 5 MG tablet Take 5 mg by mouth daily.   aspirin  EC 81 MG tablet Take 1 tablet (81 mg total) by mouth daily. Swallow whole.  B COMPLEX VITAMINS SL Place 1 drop under the tongue daily.   Cholecalciferol (VITAMIN D3) 50 MCG (2000 UT) capsule Take 2,000 Units by mouth daily.   diclofenac (VOLTAREN) 75 MG EC tablet Take 75 mg by mouth 2 (two) times daily as needed for mild pain (pain score 1-3) or moderate pain (pain score 4-6).   famotidine (PEPCID) 20 MG tablet Take 20 mg by mouth daily as needed for heartburn or indigestion.   loratadine (CLARITIN) 10 MG tablet Take 10 mg by mouth daily as needed for allergies or rhinitis.   traZODone (DESYREL) 50 MG tablet Take 50 mg by mouth at bedtime.   triamterene -hydrochlorothiazide (MAXZIDE-25) 37.5-25 MG tablet Take 0.5 tablets by mouth daily.   [DISCONTINUED] rosuvastatin  (CRESTOR ) 20 MG tablet Take 1 tablet (20 mg total) by mouth daily.     Allergies:    Patient has no known allergies.   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  Tobacco Use   Smoking status: Never   Smokeless tobacco: Never  Substance and Sexual Activity   Alcohol use: No   Drug use: No   Sexual activity: Not on file  Other Topics Concern   Not on file  Social History Narrative   Not on file   Social Drivers of Health   Financial Resource Strain: Not on file  Food Insecurity: Not on file  Transportation Needs: Not on file  Physical Activity: Not on file  Stress: Not on file  Social Connections: Not on file     Family History: The patient's family history includes Anxiety disorder in her mother; Diabetes in her mother; Heart disease in her father; High blood pressure in her mother. ROS:   Please see the history of present illness.    All 14 point review of systems negative except as described per history of present illness.  EKGs/Labs/Other Studies Reviewed:    The following studies were reviewed today:   EKG:       Recent Labs: 03/19/2024: ALT 53 04/23/2024: BUN 15; Creatinine, Ser 0.82; Hemoglobin 14.3; Platelets 258; Potassium 3.8; Sodium 138  Recent Lipid Panel    Component Value Date/Time   CHOL 181 04/23/2024 1141   TRIG 116 04/23/2024 1141   HDL 69 04/23/2024 1141   CHOLHDL 2.6 04/23/2024 1141   LDLCALC 92 04/23/2024 1141    Physical Exam:    VS:  BP 128/80 (BP Location: Left Arm, Patient Position: Sitting, Cuff Size: Normal)   Pulse 87   Ht 5' 4.5 (1.638 m)   Wt 198 lb (89.8 kg)   BMI 33.46 kg/m     Wt Readings from Last 3 Encounters:  06/04/24 198 lb (89.8 kg)  04/29/24 200 lb (90.7 kg)  04/23/24 200 lb 1.3 oz (90.8 kg)     GENERAL:  Well nourished, well developed in no acute distress NECK: No JVD; No carotid  bruits CARDIAC: RRR, S1 and S2 present, no murmurs, no rubs, no gallops CHEST:  Clear to auscultation without rales, wheezing or rhonchi  Extremities: No pitting pedal edema. Pulses bilaterally symmetric with radial 2+ and dorsalis pedis 2+.  Right radial cath access site appears well-healed without any tenderness swelling or bruising. NEUROLOGIC:  Alert and oriented x 3  Medication Adjustments/Labs and Tests Ordered: Current medicines are reviewed at length with the patient today.  Concerns regarding medicines are outlined above.  Orders Placed This Encounter  Procedures   Comprehensive metabolic panel with GFR   Meds ordered this encounter  Medications   rosuvastatin  (CRESTOR ) 40 MG tablet    Sig: Take 1 tablet (40 mg total) by mouth daily.    Dispense:  90 tablet    Refill:  3    Signed, Menna Abeln reddy Lene Mckay, MD, MPH, [facility] 06/04/2024 1:24 PM    [facility]

## 2024-06-04 NOTE — Assessment & Plan Note (Signed)
 Well-controlled on current regimen with amlodipine 5 mg once daily Hydrochlorothiazide-triamterene  25 mg - 37.5 mg half a tablet once daily. Target blood pressure below 130/80 mmHg.

## 2024-06-04 NOTE — Assessment & Plan Note (Signed)
 Progressive dyspnea on exertion in the setting of nonobstructive moderate coronary atherosclerosis likely related to deconditioning.  - Continue aspirin  81 mg once daily Continue aggressive lipid-lowering therapy. Titrate up rosuvastatin  to 40 mg once daily.  Educated her about gradually increasing her activity/exercise to help with her overall effort tolerance.

## 2024-06-18 DIAGNOSIS — H02831 Dermatochalasis of right upper eyelid: Secondary | ICD-10-CM | POA: Diagnosis not present

## 2024-06-18 DIAGNOSIS — H35372 Puckering of macula, left eye: Secondary | ICD-10-CM | POA: Diagnosis not present

## 2024-06-18 DIAGNOSIS — H26491 Other secondary cataract, right eye: Secondary | ICD-10-CM | POA: Diagnosis not present

## 2024-06-18 DIAGNOSIS — H34831 Tributary (branch) retinal vein occlusion, right eye, with macular edema: Secondary | ICD-10-CM | POA: Diagnosis not present

## 2024-06-18 DIAGNOSIS — Z961 Presence of intraocular lens: Secondary | ICD-10-CM | POA: Diagnosis not present

## 2024-06-18 DIAGNOSIS — H34812 Central retinal vein occlusion, left eye, with macular edema: Secondary | ICD-10-CM | POA: Diagnosis not present

## 2024-06-24 DIAGNOSIS — I10 Essential (primary) hypertension: Secondary | ICD-10-CM | POA: Diagnosis not present

## 2024-06-24 DIAGNOSIS — R739 Hyperglycemia, unspecified: Secondary | ICD-10-CM | POA: Diagnosis not present

## 2024-06-24 DIAGNOSIS — E782 Mixed hyperlipidemia: Secondary | ICD-10-CM | POA: Diagnosis not present

## 2024-06-26 DIAGNOSIS — H34812 Central retinal vein occlusion, left eye, with macular edema: Secondary | ICD-10-CM | POA: Diagnosis not present

## 2024-06-26 DIAGNOSIS — H348312 Tributary (branch) retinal vein occlusion, right eye, stable: Secondary | ICD-10-CM | POA: Diagnosis not present

## 2024-06-26 DIAGNOSIS — H31093 Other chorioretinal scars, bilateral: Secondary | ICD-10-CM | POA: Diagnosis not present

## 2024-06-26 DIAGNOSIS — H43813 Vitreous degeneration, bilateral: Secondary | ICD-10-CM | POA: Diagnosis not present

## 2024-06-26 DIAGNOSIS — H35033 Hypertensive retinopathy, bilateral: Secondary | ICD-10-CM | POA: Diagnosis not present

## 2024-08-06 DIAGNOSIS — I10 Essential (primary) hypertension: Secondary | ICD-10-CM | POA: Diagnosis not present

## 2024-08-06 DIAGNOSIS — E785 Hyperlipidemia, unspecified: Secondary | ICD-10-CM | POA: Diagnosis not present

## 2024-08-06 DIAGNOSIS — E119 Type 2 diabetes mellitus without complications: Secondary | ICD-10-CM | POA: Diagnosis not present

## 2024-09-24 ENCOUNTER — Other Ambulatory Visit: Payer: Self-pay | Admitting: Physician Assistant

## 2024-09-24 ENCOUNTER — Encounter: Payer: Self-pay | Admitting: Physician Assistant

## 2024-09-24 DIAGNOSIS — R238 Other skin changes: Secondary | ICD-10-CM

## 2024-10-22 ENCOUNTER — Encounter

## 2024-10-22 ENCOUNTER — Ambulatory Visit
Admission: RE | Admit: 2024-10-22 | Discharge: 2024-10-22 | Disposition: A | Source: Ambulatory Visit | Attending: Physician Assistant | Admitting: Physician Assistant

## 2024-10-22 ENCOUNTER — Other Ambulatory Visit

## 2024-10-22 DIAGNOSIS — R238 Other skin changes: Secondary | ICD-10-CM
